# Patient Record
Sex: Female | Born: 1935 | Race: White | Hispanic: No | Smoking: Never smoker
Health system: Southern US, Community
[De-identification: ages and names within clinical notes are randomized; demographics above are authoritative.]

## PROBLEM LIST (undated history)

## (undated) DIAGNOSIS — Z8719 Personal history of other diseases of the digestive system: Secondary | ICD-10-CM

## (undated) DIAGNOSIS — I340 Nonrheumatic mitral (valve) insufficiency: Secondary | ICD-10-CM

## (undated) DIAGNOSIS — R221 Localized swelling, mass and lump, neck: Secondary | ICD-10-CM

## (undated) DIAGNOSIS — R32 Unspecified urinary incontinence: Secondary | ICD-10-CM

## (undated) DIAGNOSIS — Z950 Presence of cardiac pacemaker: Secondary | ICD-10-CM

## (undated) DIAGNOSIS — I739 Peripheral vascular disease, unspecified: Secondary | ICD-10-CM

## (undated) DIAGNOSIS — I495 Sick sinus syndrome: Secondary | ICD-10-CM

## (undated) DIAGNOSIS — M1990 Unspecified osteoarthritis, unspecified site: Secondary | ICD-10-CM

## (undated) DIAGNOSIS — R002 Palpitations: Secondary | ICD-10-CM

## (undated) DIAGNOSIS — I471 Supraventricular tachycardia, unspecified: Secondary | ICD-10-CM

## (undated) HISTORY — PX: INSERT / REPLACE / REMOVE PACEMAKER: SUR710

## (undated) HISTORY — DX: Localized swelling, mass and lump, neck: R22.1

## (undated) HISTORY — PX: CHOLECYSTECTOMY: SHX55

## (undated) HISTORY — PX: ABDOMINAL HYSTERECTOMY: SHX81

---

## 2013-01-06 DIAGNOSIS — R221 Localized swelling, mass and lump, neck: Secondary | ICD-10-CM

## 2013-01-06 HISTORY — DX: Localized swelling, mass and lump, neck: R22.1

## 2013-01-09 ENCOUNTER — Other Ambulatory Visit: Payer: Self-pay | Admitting: Vascular Surgery

## 2013-01-09 DIAGNOSIS — R220 Localized swelling, mass and lump, head: Secondary | ICD-10-CM

## 2013-01-25 ENCOUNTER — Encounter: Payer: Self-pay | Admitting: Vascular Surgery

## 2013-02-08 ENCOUNTER — Other Ambulatory Visit (HOSPITAL_COMMUNITY): Payer: Medicare Other

## 2013-02-08 ENCOUNTER — Encounter: Payer: Medicare Other | Admitting: Vascular Surgery

## 2013-09-21 ENCOUNTER — Other Ambulatory Visit: Payer: Self-pay | Admitting: Orthopedic Surgery

## 2013-10-18 ENCOUNTER — Inpatient Hospital Stay (HOSPITAL_COMMUNITY): Admission: RE | Admit: 2013-10-18 | Payer: Medicare Other | Source: Ambulatory Visit | Admitting: Orthopedic Surgery

## 2013-10-18 ENCOUNTER — Encounter (HOSPITAL_COMMUNITY): Admission: RE | Payer: Self-pay | Source: Ambulatory Visit

## 2013-10-18 SURGERY — ARTHROPLASTY, HIP, TOTAL, ANTERIOR APPROACH
Anesthesia: Choice | Site: Hip | Laterality: Right

## 2014-07-26 NOTE — H&P (Signed)
TOTAL HIP ADMISSION H&P  Patient is admitted for right total hip arthroplasty, anterior approach.  Subjective:  Chief Complaint:    Right hip primary OA / pain  HPI: [employer], 80 y.o. female, has a history of pain and functional disability in the right hip(s) due to arthritis and patient has failed non-surgical conservative treatments for greater than 12 weeks to include NSAID's and/or analgesics, corticosteriod injections, use of assistive devices and activity modification.  Onset of symptoms was gradual starting 2+ years ago with gradually worsening course since that time.The patient noted no past surgery on the right hip(s).  Patient currently rates pain in the right hip at 9 out of 10 with activity. Patient has night pain, worsening of pain with activity and weight bearing, trendelenberg gait, pain that interfers with activities of daily living and pain with passive range of motion. Patient has evidence of periarticular osteophytes and joint space narrowing by imaging studies. This condition presents safety issues increasing the risk of falls.  There is no current active infection.  Risks, benefits and expectations were discussed with the patient.  Risks including but not limited to the risk of anesthesia, blood clots, nerve damage, blood vessel damage, failure of the prosthesis, infection and up to and including death.  Patient understand the risks, benefits and expectations and wishes to proceed with surgery.   PCP: No primary care provider on file.  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  Past Medical History  Diagnosis Date  . Mass in neck Nov. 21, 2014    Right - enlarged and very tender    No past surgical history on file.   No  prescriptions prior to admission   Allergies  Allergen Reactions  . Penicillins     History  Substance Use Topics  . Smoking status: Not on file  . Smokeless tobacco: Not on file  . Alcohol Use: Not on file       Review of Systems  Constitutional: Negative.   HENT: Negative.   Eyes: Negative.   Respiratory: Negative.   Cardiovascular: Negative.   Gastrointestinal: Positive for constipation.  Genitourinary: Positive for urgency and frequency.  Musculoskeletal: Positive for joint pain.  Skin: Negative.   Neurological: Negative.   Endo/Heme/Allergies: Negative.   Psychiatric/Behavioral: Negative.     Objective:  Physical Exam  Constitutional: She is oriented to person, place, and time. She appears well-developed and well-nourished.  HENT:  Head: Normocephalic.  Eyes: Pupils are equal, round, and reactive to light.  Neck: Neck supple. No JVD present. No tracheal deviation present. No thyromegaly present.  Cardiovascular: Normal rate, regular rhythm, normal heart sounds and intact distal pulses.   Respiratory: Effort normal and breath sounds normal. No stridor. No respiratory distress. She has no wheezes.  GI: Soft. There is no tenderness. There is no guarding.  Musculoskeletal:       Right hip: She exhibits decreased range of motion, decreased strength, tenderness and bony tenderness. She exhibits no swelling, no deformity and no laceration.  Lymphadenopathy:    She has no cervical adenopathy.  Neurological: She is alert and  oriented to person, place, and time.  Skin: Skin is warm and dry.  Psychiatric: She has a normal mood and affect.      Imaging Review Plain radiographs demonstrate severe degenerative joint disease of the right hip(s). The bone quality appears to be good for age and reported activity level.  Assessment/Plan:  End stage arthritis, right hip(s)  The patient history, physical examination, clinical judgement of the provider and imaging studies  are consistent with end stage degenerative joint disease of the right hip(s) and total hip arthroplasty is deemed medically necessary. The treatment options including medical management, injection therapy, arthroscopy and arthroplasty were discussed at length. The risks and benefits of total hip arthroplasty were presented and reviewed. The risks due to aseptic loosening, infection, stiffness, dislocation/subluxation,  thromboembolic complications and other imponderables were discussed.  The patient acknowledged the explanation, agreed to proceed with the plan and consent was signed. Patient is being admitted for inpatient treatment for surgery, pain control, PT, OT, prophylactic antibiotics, VTE prophylaxis, progressive ambulation and ADL's and discharge planning.The patient is planning to be discharged home with home health services.     Anastasio Auerbach    PA-C  07/26/2014, 1:42 PM

## 2014-08-07 ENCOUNTER — Encounter (HOSPITAL_COMMUNITY): Payer: Self-pay

## 2014-08-07 NOTE — Patient Instructions (Signed)
[address] [place]  08/07/2014   Your procedure is scheduled on:   6-28 -2016 Tuesday  Enter through [facility]  Entrance and follow signs to [facility]. Arrive at      0955  AM ..  (Limit 1 person with you).  Call this number if you have problems the morning of surgery: [phone]  Or Presurgical Testing [phone].              For Living Will and/or Health Care Power Attorney Forms: please provide copy for your medical record,may bring AM of surgery(Forms should be already notarized              -we do not provide this service).(Yes/ No information preferred today).             For Cpap use: Bring mask and tubing only.   Do not eat food/ or drink: After Midnight.      Take these medicines the morning of surgery with A SIP OF WATER:  Metoprolol. Omeprazole. Loratadine-if need.   Do not wear jewelry, make-up or nail polish.  Do not wear deodorant, lotions, powders, or perfumes.   Do not shave legs and under arms- 48 hours(2 days) prior to first CHG shower.(Shaving face and neck okay.)  Do not bring valuables to the hospital.(Hospital is not responsible for lost valuables).  Contacts, dentures or removable bridgework, body piercing, hair pins may not be worn into surgery.  Leave suitcase in the car. After surgery it may be brought to your room.  For patients admitted to the hospital, checkout time is 11:00 AM the day of discharge.(Restricted visitors-Any Persons displaying flu-like symptoms or illness).    Patients discharged the day of surgery will not be allowed to drive home. Must have responsible person with you x 24 hours once discharged.  Name and phone number of your driver:      Please read over the following fact sheets that you were given:  CHG(Chlorhexidine Gluconate 4% Surgical Soap) use, MRSA Information, Blood Transfusion fact sheet, Incentive Spirometry Instruction.  Remember : Type/Screen "Blue armbands" - may not be removed once applied(would result in  being retested AM of surgery, if removed).         [facility] - Preparing for Surgery Before surgery, you can play an important role.  Because skin is not sterile, your skin needs to be as free of germs as possible.  You can reduce the number of germs on your skin by washing with CHG (chlorahexidine gluconate) soap before surgery.  CHG is an antiseptic cleaner which kills germs and bonds with the skin to continue killing germs even after washing. Please DO NOT use if you have an allergy to CHG or antibacterial soaps.  If your skin becomes reddened/irritated stop using the CHG and inform your nurse when you arrive at Short Stay. Do not shave (including legs and underarms) for at least 48 hours prior to the first CHG shower.  You may shave your face/neck. Please follow these instructions carefully:  1.  Shower with CHG Soap the night before surgery and the  morning of Surgery.  2.  If you choose to wash your hair, wash your hair first as usual with your  normal  shampoo.  3.  After you shampoo, rinse your hair and body thoroughly to remove the  shampoo.  4.  Use CHG as you would any other liquid soap.  You can apply chg directly  to the skin and wash                       Gently with a scrungie or clean washcloth.  5.  Apply the CHG Soap to your body ONLY FROM THE NECK DOWN.   Do not use on face/ open                           Wound or open sores. Avoid contact with eyes, ears mouth and genitals (private parts).                       Wash face,  Genitals (private parts) with your normal soap.             6.  Wash thoroughly, paying special attention to the area where your surgery  will be performed.  7.  Thoroughly rinse your body with warm water from the neck down.  8.  DO NOT shower/wash with your normal soap after using and rinsing off  the CHG Soap.                9.  Pat yourself dry with a clean towel.            10.  Wear clean pajamas.            11.  Place clean  sheets on your bed the night of your first shower and do not  sleep with pets. Day of Surgery : Do not apply any lotions/deodorants the morning of surgery.  Please wear clean clothes to the hospital/surgery center.  FAILURE TO FOLLOW THESE INSTRUCTIONS MAY RESULT IN THE CANCELLATION OF YOUR SURGERY PATIENT SIGNATURE_________________________________  NURSE SIGNATURE__________________________________  ________________________________________________________________________   Dawn Henderson  An incentive spirometer is a tool that can help keep your lungs clear and active. This tool measures how well you are filling your lungs with each breath. Taking long deep breaths may help reverse or decrease the chance of developing breathing (pulmonary) problems (especially infection) following:  A long period of time when you are unable to move or be active. BEFORE THE PROCEDURE   If the spirometer includes an indicator to show your best effort, your nurse or respiratory therapist will set it to a desired goal.  If possible, sit up straight or lean slightly forward. Try not to slouch.  Hold the incentive spirometer in an upright position. INSTRUCTIONS FOR USE   Sit on the edge of your bed if possible, or sit up as far as you can in bed or on a chair.  Hold the incentive spirometer in an upright position.  Breathe out normally.  Place the mouthpiece in your mouth and seal your lips tightly around it.  Breathe in slowly and as deeply as possible, raising the piston or the ball toward the top of the column.  Hold your breath for 3-5 seconds or for as long as possible. Allow the piston or ball to fall to the bottom of the column.  Remove the mouthpiece from your mouth and breathe out normally.  Rest for a few seconds and repeat Steps 1 through 7 at least 10 times every 1-2 hours when you are awake. Take your time and take a few normal breaths between deep breaths.  The spirometer may  include an indicator to  show your best effort. Use the indicator as a goal to work toward during each repetition.  After each set of 10 deep breaths, practice coughing to be sure your lungs are clear. If you have an incision (the cut made at the time of surgery), support your incision when coughing by placing a pillow or rolled up towels firmly against it. Once you are able to get out of bed, walk around indoors and cough well. You may stop using the incentive spirometer when instructed by your caregiver.  RISKS AND COMPLICATIONS  Take your time so you do not get dizzy or light-headed.  If you are in pain, you may need to take or ask for pain medication before doing incentive spirometry. It is harder to take a deep breath if you are having pain. AFTER USE  Rest and breathe slowly and easily.  It can be helpful to keep track of a log of your progress. Your caregiver can provide you with a simple table to help with this. If you are using the spirometer at home, follow these instructions: SEEK MEDICAL CARE IF:   You are having difficultly using the spirometer.  You have trouble using the spirometer as often as instructed.  Your pain medication is not giving enough relief while using the spirometer.  You develop fever of 100.5 F (38.1 C) or higher. SEEK IMMEDIATE MEDICAL CARE IF:   You cough up bloody sputum that had not been present before.  You develop fever of 102 F (38.9 C) or greater.  You develop worsening pain at or near the incision site. MAKE SURE YOU:   Understand these instructions.  Will watch your condition.  Will get help right away if you are not doing well or get worse. Document Released: 06/15/2006 Document Revised: 04/27/2011 Document Reviewed: 08/16/2006 [employer] Patient Information 2014 [employer], [place].   ________________________________________________________________________  WHAT IS A BLOOD TRANSFUSION? Blood Transfusion Information  A transfusion  is the replacement of blood or some of its parts. Blood is made up of multiple cells which provide different functions.  Red blood cells carry oxygen and are used for blood loss replacement.  White blood cells fight against infection.  Platelets control bleeding.  Plasma helps clot blood.  Other blood products are available for specialized needs, such as hemophilia or other clotting disorders. BEFORE THE TRANSFUSION  Who gives blood for transfusions?   Healthy volunteers who are fully evaluated to make sure their blood is safe. This is blood bank blood. Transfusion therapy is the safest it has ever been in the practice of medicine. Before blood is taken from a donor, a complete history is taken to make sure that person has no history of diseases nor engages in risky social behavior (examples are intravenous drug use or sexual activity with multiple partners). The donor's travel history is screened to minimize risk of transmitting infections, such as malaria. The donated blood is tested for signs of infectious diseases, such as HIV and hepatitis. The blood is then tested to be sure it is compatible with you in order to minimize the chance of a transfusion reaction. If you or a relative donates blood, this is often done in anticipation of surgery and is not appropriate for emergency situations. It takes many days to process the donated blood. RISKS AND COMPLICATIONS Although transfusion therapy is very safe and saves many lives, the main dangers of transfusion include:   Getting an infectious disease.  Developing a transfusion reaction. This is an allergic reaction to  something in the blood you were given. Every precaution is taken to prevent this. The decision to have a blood transfusion has been considered carefully by your caregiver before blood is given. Blood is not given unless the benefits outweigh the risks. AFTER THE TRANSFUSION  Right after receiving a blood transfusion, you will  usually feel much better and more energetic. This is especially true if your red blood cells have gotten low (anemic). The transfusion raises the level of the red blood cells which carry oxygen, and this usually causes an energy increase.  The nurse administering the transfusion will monitor you carefully for complications. HOME CARE INSTRUCTIONS  No special instructions are needed after a transfusion. You may find your energy is better. Speak with your caregiver about any limitations on activity for underlying diseases you may have. SEEK MEDICAL CARE IF:   Your condition is not improving after your transfusion.  You develop redness or irritation at the intravenous (IV) site. SEEK IMMEDIATE MEDICAL CARE IF:  Any of the following symptoms occur over the next 12 hours:  Shaking chills.  You have a temperature by mouth above 102 F (38.9 C), not controlled by medicine.  Chest, back, or muscle pain.  People around you feel you are not acting correctly or are confused.  Shortness of breath or difficulty breathing.  Dizziness and fainting.  You get a rash or develop hives.  You have a decrease in urine output.  Your urine turns a dark color or changes to pink, red, or brown. Any of the following symptoms occur over the next 10 days:  You have a temperature by mouth above 102 F (38.9 C), not controlled by medicine.  Shortness of breath.  Weakness after normal activity.  The white part of the eye turns yellow (jaundice).  You have a decrease in the amount of urine or are urinating less often.  Your urine turns a dark color or changes to pink, red, or brown. Document Released: 01/31/2000 Document Revised: 04/27/2011 Document Reviewed: 09/19/2007 [facility] Patient Information 2014 [place], [place].  _______________________________________________________________________

## 2014-08-07 NOTE — Progress Notes (Signed)
08-07-14 1410 Would you like a urinalysis for this pt for PAT visit, not entered with order sets.

## 2014-08-08 ENCOUNTER — Inpatient Hospital Stay (HOSPITAL_COMMUNITY)
Admission: RE | Admit: 2014-08-08 | Discharge: 2014-08-08 | Disposition: A | Discharge status: Home / Self Care | Payer: Medicare Other | Source: Ambulatory Visit

## 2014-08-08 HISTORY — DX: Presence of cardiac pacemaker: Z95.0

## 2014-08-08 NOTE — Patient Instructions (Addendum)
Dawn Henderson  08/08/2014   Your procedure is scheduled on:      08/14/2014    Report to [facility] Main  Entrance take [facility]  elevators to 3rd floor to  Short Stay Center at    1000 AM.  Call this number if you have problems the morning of surgery [phone]   Remember: ONLY 1 PERSON MAY GO WITH YOU TO SHORT STAY TO GET  READY MORNING OF YOUR SURGERY.  Do not eat food or drink liquids :After Midnight.     Take these medicines the morning of surgery with A SIP OF WATER:    Hydrocodone if needed, Claritin if needed, Metoprolol ( Lopressor), Prilosec                                You may not have any metal on your body including hair pins and              piercings  Do not wear jewelry, make-up, lotions, powders or perfumes, deodorant             Do not wear nail polish.  Do not shave  48 hours prior to surgery.                Do not bring valuables to the hospital. [facility] IS NOT             RESPONSIBLE   FOR VALUABLES.  Contacts, dentures or bridgework may not be worn into surgery.  Leave suitcase in the car. After surgery it may be brought to your room.        Special Instructions: coughing and deep breathing exercises, leg exercises               Please read over the following fact sheets you were given: _____________________________________________________________________             [facility] - Preparing for Surgery Before surgery, you can play an important role.  Because skin is not sterile, your skin needs to be as free of germs as possible.  You can reduce the number of germs on your skin by washing with CHG (chlorahexidine gluconate) soap before surgery.  CHG is an antiseptic cleaner which kills germs and bonds with the skin to continue killing germs even after washing. Please DO NOT use if you have an allergy to CHG or antibacterial soaps.  If your skin becomes reddened/irritated stop using the CHG and inform your nurse when you arrive at  Short Stay. Do not shave (including legs and underarms) for at least 48 hours prior to the first CHG shower.  You may shave your face/neck. Please follow these instructions carefully:  1.  Shower with CHG Soap the night before surgery and the  morning of Surgery.  2.  If you choose to wash your hair, wash your hair first as usual with your  normal  shampoo.  3.  After you shampoo, rinse your hair and body thoroughly to remove the  shampoo.                           4.  Use CHG as you would any other liquid soap.  You can apply chg directly  to the skin and wash  Gently with a scrungie or clean washcloth.  5.  Apply the CHG Soap to your body ONLY FROM THE NECK DOWN.   Do not use on face/ open                           Wound or open sores. Avoid contact with eyes, ears mouth and genitals (private parts).                       Wash face,  Genitals (private parts) with your normal soap.             6.  Wash thoroughly, paying special attention to the area where your surgery  will be performed.  7.  Thoroughly rinse your body with warm water from the neck down.  8.  DO NOT shower/wash with your normal soap after using and rinsing off  the CHG Soap.                9.  Pat yourself dry with a clean towel.            10.  Wear clean pajamas.            11.  Place clean sheets on your bed the night of your first shower and do not  sleep with pets. Day of Surgery : Do not apply any lotions/deodorants the morning of surgery.  Please wear clean clothes to the hospital/surgery center.  FAILURE TO FOLLOW THESE INSTRUCTIONS MAY RESULT IN THE CANCELLATION OF YOUR SURGERY PATIENT SIGNATURE_________________________________  NURSE SIGNATURE__________________________________  ________________________________________________________________________  WHAT IS A BLOOD TRANSFUSION? Blood Transfusion Information  A transfusion is the replacement of blood or some of its parts. Blood is made up  of multiple cells which provide different functions.  Red blood cells carry oxygen and are used for blood loss replacement.  White blood cells fight against infection.  Platelets control bleeding.  Plasma helps clot blood.  Other blood products are available for specialized needs, such as hemophilia or other clotting disorders. BEFORE THE TRANSFUSION  Who gives blood for transfusions?   Healthy volunteers who are fully evaluated to make sure their blood is safe. This is blood bank blood. Transfusion therapy is the safest it has ever been in the practice of medicine. Before blood is taken from a donor, a complete history is taken to make sure that person has no history of diseases nor engages in risky social behavior (examples are intravenous drug use or sexual activity with multiple partners). The donor's travel history is screened to minimize risk of transmitting infections, such as malaria. The donated blood is tested for signs of infectious diseases, such as HIV and hepatitis. The blood is then tested to be sure it is compatible with you in order to minimize the chance of a transfusion reaction. If you or a relative donates blood, this is often done in anticipation of surgery and is not appropriate for emergency situations. It takes many days to process the donated blood. RISKS AND COMPLICATIONS Although transfusion therapy is very safe and saves many lives, the main dangers of transfusion include:  1. Getting an infectious disease. 2. Developing a transfusion reaction. This is an allergic reaction to something in the blood you were given. Every precaution is taken to prevent this. The decision to have a blood transfusion has been considered carefully by your caregiver before blood is given. Blood is not given unless the benefits outweigh  the risks. AFTER THE TRANSFUSION  Right after receiving a blood transfusion, you will usually feel much better and more energetic. This is especially true  if your red blood cells have gotten low (anemic). The transfusion raises the level of the red blood cells which carry oxygen, and this usually causes an energy increase.  The nurse administering the transfusion will monitor you carefully for complications. HOME CARE INSTRUCTIONS  No special instructions are needed after a transfusion. You may find your energy is better. Speak with your caregiver about any limitations on activity for underlying diseases you may have. SEEK MEDICAL CARE IF:   Your condition is not improving after your transfusion.  You develop redness or irritation at the intravenous (IV) site. SEEK IMMEDIATE MEDICAL CARE IF:  Any of the following symptoms occur over the next 12 hours:  Shaking chills.  You have a temperature by mouth above 102 F (38.9 C), not controlled by medicine.  Chest, back, or muscle pain.  People around you feel you are not acting correctly or are confused.  Shortness of breath or difficulty breathing.  Dizziness and fainting.  You get a rash or develop hives.  You have a decrease in urine output.  Your urine turns a dark color or changes to pink, red, or brown. Any of the following symptoms occur over the next 10 days:  You have a temperature by mouth above 102 F (38.9 C), not controlled by medicine.  Shortness of breath.  Weakness after normal activity.  The white part of the eye turns yellow (jaundice).  You have a decrease in the amount of urine or are urinating less often.  Your urine turns a dark color or changes to pink, red, or brown. Document Released: 01/31/2000 Document Revised: 04/27/2011 Document Reviewed: 09/19/2007 ExitCare Patient Information 2014 [place].  _______________________________________________________________________  Incentive Spirometer  An incentive spirometer is a tool that can help keep your lungs clear and active. This tool measures how well you are filling your lungs with each  breath. Taking long deep breaths may help reverse or decrease the chance of developing breathing (pulmonary) problems (especially infection) following:  A long period of time when you are unable to move or be active. BEFORE THE PROCEDURE   If the spirometer includes an indicator to show your best effort, your nurse or respiratory therapist will set it to a desired goal.  If possible, sit up straight or lean slightly forward. Try not to slouch.  Hold the incentive spirometer in an upright position. INSTRUCTIONS FOR USE  3. Sit on the edge of your bed if possible, or sit up as far as you can in bed or on a chair. 4. Hold the incentive spirometer in an upright position. 5. Breathe out normally. 6. Place the mouthpiece in your mouth and seal your lips tightly around it. 7. Breathe in slowly and as deeply as possible, raising the piston or the ball toward the top of the column. 8. Hold your breath for 3-5 seconds or for as long as possible. Allow the piston or ball to fall to the bottom of the column. 9. Remove the mouthpiece from your mouth and breathe out normally. 10. Rest for a few seconds and repeat Steps 1 through 7 at least 10 times every 1-2 hours when you are awake. Take your time and take a few normal breaths between deep breaths. 11. The spirometer may include an indicator to show your best effort. Use the indicator as a goal to work  toward during each repetition. 12. After each set of 10 deep breaths, practice coughing to be sure your lungs are clear. If you have an incision (the cut made at the time of surgery), support your incision when coughing by placing a pillow or rolled up towels firmly against it. Once you are able to get out of bed, walk around indoors and cough well. You may stop using the incentive spirometer when instructed by your caregiver.  RISKS AND COMPLICATIONS  Take your time so you do not get dizzy or light-headed.  If you are in pain, you may need to take or ask  for pain medication before doing incentive spirometry. It is harder to take a deep breath if you are having pain. AFTER USE  Rest and breathe slowly and easily.  It can be helpful to keep track of a log of your progress. Your caregiver can provide you with a simple table to help with this. If you are using the spirometer at home, follow these instructions: [place] IF:   You are having difficultly using the spirometer.  You have trouble using the spirometer as often as instructed.  Your pain medication is not giving enough relief while using the spirometer.  You develop fever of 100.5 F (38.1 C) or higher. SEEK IMMEDIATE MEDICAL CARE IF:   You cough up bloody sputum that had not been present before.  You develop fever of 102 F (38.9 C) or greater.  You develop worsening pain at or near the incision site. MAKE SURE YOU:   Understand these instructions.  Will watch your condition.  Will get help right away if you are not doing well or get worse. Document Released: 06/15/2006 Document Revised: 04/27/2011 Document Reviewed: 08/16/2006 [facility] Patient Information 2014 [place], [place].   ________________________________________________________________________

## 2014-08-10 ENCOUNTER — Encounter (HOSPITAL_COMMUNITY): Payer: Self-pay

## 2014-08-10 ENCOUNTER — Encounter (HOSPITAL_COMMUNITY)
Admission: RE | Admit: 2014-08-10 | Discharge: 2014-08-10 | Disposition: A | Discharge status: Home / Self Care | Payer: Medicare Other | Source: Ambulatory Visit | Attending: Orthopedic Surgery | Admitting: Orthopedic Surgery

## 2014-08-10 DIAGNOSIS — Z01818 Encounter for other preprocedural examination: Secondary | ICD-10-CM | POA: Diagnosis not present

## 2014-08-10 DIAGNOSIS — M1611 Unilateral primary osteoarthritis, right hip: Secondary | ICD-10-CM | POA: Insufficient documentation

## 2014-08-10 HISTORY — DX: Personal history of other diseases of the digestive system: Z87.19

## 2014-08-10 HISTORY — DX: Palpitations: R00.2

## 2014-08-10 HISTORY — DX: Supraventricular tachycardia, unspecified: I47.10

## 2014-08-10 HISTORY — DX: Nonrheumatic mitral (valve) insufficiency: I34.0

## 2014-08-10 HISTORY — DX: Peripheral vascular disease, unspecified: I73.9

## 2014-08-10 HISTORY — DX: Sick sinus syndrome: I49.5

## 2014-08-10 HISTORY — DX: Unspecified urinary incontinence: R32

## 2014-08-10 HISTORY — DX: Unspecified osteoarthritis, unspecified site: M19.90

## 2014-08-10 HISTORY — DX: Supraventricular tachycardia: I47.1

## 2014-08-10 LAB — CBC
HCT: 41.8 % (ref 36.0–46.0)
Hemoglobin: 14.3 g/dL (ref 12.0–15.0)
MCH: 33.5 pg (ref 26.0–34.0)
MCHC: 34.2 g/dL (ref 30.0–36.0)
MCV: 97.9 fL (ref 78.0–100.0)
PLATELETS: 124 10*3/uL — AB (ref 150–400)
RBC: 4.27 MIL/uL (ref 3.87–5.11)
RDW: 12.3 % (ref 11.5–15.5)
WBC: 4.2 10*3/uL (ref 4.0–10.5)

## 2014-08-10 LAB — BASIC METABOLIC PANEL
Anion gap: 8 (ref 5–15)
BUN: 22 mg/dL — AB (ref 6–20)
CHLORIDE: 101 mmol/L (ref 101–111)
CO2: 31 mmol/L (ref 22–32)
CREATININE: 0.86 mg/dL (ref 0.44–1.00)
Calcium: 10 mg/dL (ref 8.9–10.3)
GFR calc Af Amer: >60 mL/min (ref >60)
GFR calc non Af Amer: >60 mL/min (ref >60)
Glucose, Bld: 99 mg/dL (ref 65–99)
Potassium: 5.1 mmol/L (ref 3.5–5.1)
Sodium: 140 mmol/L (ref 135–145)

## 2014-08-10 LAB — SURGICAL PCR SCREEN
MRSA, PCR: NEGATIVE
STAPHYLOCOCCUS AUREUS: NEGATIVE

## 2014-08-10 LAB — PROTIME-INR
INR: 1 (ref 0.00–1.49)
Prothrombin Time: 13.4 seconds (ref 11.6–15.2)

## 2014-08-10 LAB — URINALYSIS, ROUTINE W REFLEX MICROSCOPIC
Bilirubin Urine: NEGATIVE
GLUCOSE, UA: NEGATIVE mg/dL
Hgb urine dipstick: NEGATIVE
Ketones, ur: NEGATIVE mg/dL
LEUKOCYTES UA: NEGATIVE
Nitrite: NEGATIVE
PH: 7.5 (ref 5.0–8.0)
Protein, ur: NEGATIVE mg/dL
SPECIFIC GRAVITY, URINE: 1.022 (ref 1.005–1.030)
Urobilinogen, UA: 0.2 mg/dL (ref 0.0–1.0)

## 2014-08-10 LAB — APTT: APTT: 35 s (ref 24–37)

## 2014-08-10 LAB — ABO/RH: ABO/RH(D): A POS

## 2014-08-10 NOTE — Progress Notes (Signed)
CBC and BMP results done 08/10/2014 faxed via EPIc to Dr Charlann Boxer.

## 2014-08-10 NOTE — Progress Notes (Addendum)
ECHO- 07/24/2014 on chart  Stress 2012 on chart  08/06/14 Clearance- Dr Lorelei Pont on chart  Last Device check- 06/01/14 on chart and 12/01/2013 on chart   Dr Georganna Skeans- LOV 06/11/2014 on chart  EKG- 06/11/2014 on chart

## 2014-08-14 ENCOUNTER — Inpatient Hospital Stay (HOSPITAL_COMMUNITY): Payer: Medicare Other | Admitting: Registered Nurse

## 2014-08-14 ENCOUNTER — Encounter (HOSPITAL_COMMUNITY)
Admission: RE | Disposition: A | Discharge status: Home Health | Payer: Self-pay | Source: Ambulatory Visit | Attending: Orthopedic Surgery

## 2014-08-14 ENCOUNTER — Encounter (HOSPITAL_COMMUNITY): Payer: Self-pay | Admitting: *Deleted

## 2014-08-14 ENCOUNTER — Inpatient Hospital Stay (HOSPITAL_COMMUNITY): Payer: Medicare Other

## 2014-08-14 ENCOUNTER — Inpatient Hospital Stay (HOSPITAL_COMMUNITY)
Admission: RE | Admit: 2014-08-14 | Discharge: 2014-08-16 | DRG: 470 | Disposition: A | Discharge status: Home Health | Payer: Medicare Other | Source: Ambulatory Visit | Attending: Orthopedic Surgery | Admitting: Orthopedic Surgery

## 2014-08-14 DIAGNOSIS — Z6827 Body mass index (BMI) 27.0-27.9, adult: Secondary | ICD-10-CM

## 2014-08-14 DIAGNOSIS — Z96649 Presence of unspecified artificial hip joint: Secondary | ICD-10-CM

## 2014-08-14 DIAGNOSIS — M25551 Pain in right hip: Secondary | ICD-10-CM | POA: Diagnosis present

## 2014-08-14 DIAGNOSIS — Z950 Presence of cardiac pacemaker: Secondary | ICD-10-CM | POA: Diagnosis not present

## 2014-08-14 DIAGNOSIS — E663 Overweight: Secondary | ICD-10-CM | POA: Diagnosis present

## 2014-08-14 DIAGNOSIS — M1611 Unilateral primary osteoarthritis, right hip: Principal | ICD-10-CM | POA: Diagnosis present

## 2014-08-14 DIAGNOSIS — Z01812 Encounter for preprocedural laboratory examination: Secondary | ICD-10-CM

## 2014-08-14 HISTORY — PX: TOTAL HIP ARTHROPLASTY: SHX124

## 2014-08-14 LAB — TYPE AND SCREEN
ABO/RH(D): A POS
Antibody Screen: NEGATIVE

## 2014-08-14 SURGERY — ARTHROPLASTY, HIP, TOTAL, ANTERIOR APPROACH
Anesthesia: Spinal | Site: Hip | Laterality: Right

## 2014-08-14 MED ORDER — METOCLOPRAMIDE HCL 10 MG PO TABS: 5.0000 mg | ORAL_TABLET | Freq: Three times a day (TID) | ORAL | Status: DC | PRN

## 2014-08-14 MED ORDER — LACTATED RINGERS IV SOLN: INTRAVENOUS | Status: DC

## 2014-08-14 MED ORDER — FERROUS SULFATE 325 (65 FE) MG PO TABS
325.0000 mg | ORAL_TABLET | Freq: Three times a day (TID) | ORAL | Status: DC
  Administered 2014-08-14 – 2014-08-15 (×3): 325 mg via ORAL
  Filled 2014-08-14 (×8): qty 1

## 2014-08-14 MED ORDER — PHENYLEPHRINE HCL 10 MG/ML IJ SOLN
INTRAMUSCULAR | Status: DC | PRN
  Administered 2014-08-14 (×6): 40 ug via INTRAVENOUS

## 2014-08-14 MED ORDER — PROPOFOL 10 MG/ML IV BOLUS
INTRAVENOUS | Status: AC
  Filled 2014-08-14: qty 20

## 2014-08-14 MED ORDER — HYDROCODONE-ACETAMINOPHEN 7.5-325 MG PO TABS
1.0000 | ORAL_TABLET | ORAL | Status: DC
  Administered 2014-08-14 – 2014-08-15 (×2): 1 via ORAL
  Administered 2014-08-15: 2 via ORAL
  Administered 2014-08-15: 1 via ORAL
  Administered 2014-08-15 (×3): 2 via ORAL
  Administered 2014-08-16 (×4): 1 via ORAL
  Filled 2014-08-14 (×4): qty 1
  Filled 2014-08-14: qty 2
  Filled 2014-08-14: qty 1
  Filled 2014-08-14 (×2): qty 2
  Filled 2014-08-14: qty 1
  Filled 2014-08-14 (×2): qty 2

## 2014-08-14 MED ORDER — MIDAZOLAM HCL 2 MG/2ML IJ SOLN
INTRAMUSCULAR | Status: AC
  Filled 2014-08-14: qty 2

## 2014-08-14 MED ORDER — NON FORMULARY: 20.0000 mg | Freq: Every day | Status: DC

## 2014-08-14 MED ORDER — HYDROMORPHONE HCL 1 MG/ML IJ SOLN
0.5000 mg | INTRAMUSCULAR | Status: DC | PRN
  Administered 2014-08-14 (×2): 1 mg via INTRAVENOUS
  Administered 2014-08-14: 0.5 mg via INTRAVENOUS
  Administered 2014-08-14 – 2014-08-15 (×3): 1 mg via INTRAVENOUS
  Filled 2014-08-14 (×7): qty 1

## 2014-08-14 MED ORDER — CEFAZOLIN SODIUM 1-5 GM-% IV SOLN
INTRAVENOUS | Status: DC | PRN
  Administered 2014-08-14: 2 g via INTRAVENOUS

## 2014-08-14 MED ORDER — PHENYLEPHRINE 40 MCG/ML (10ML) SYRINGE FOR IV PUSH (FOR BLOOD PRESSURE SUPPORT)
PREFILLED_SYRINGE | INTRAVENOUS | Status: AC
  Filled 2014-08-14: qty 10

## 2014-08-14 MED ORDER — POLYETHYLENE GLYCOL 3350 17 G PO PACK
17.0000 g | PACK | Freq: Two times a day (BID) | ORAL | Status: DC
  Administered 2014-08-14 – 2014-08-16 (×4): 17 g via ORAL

## 2014-08-14 MED ORDER — FENTANYL CITRATE (PF) 250 MCG/5ML IJ SOLN
INTRAMUSCULAR | Status: AC
  Filled 2014-08-14: qty 5

## 2014-08-14 MED ORDER — DEXAMETHASONE SODIUM PHOSPHATE 10 MG/ML IJ SOLN
10.0000 mg | Freq: Once | INTRAMUSCULAR | Status: AC
  Administered 2014-08-15: 10 mg via INTRAVENOUS
  Filled 2014-08-14: qty 1

## 2014-08-14 MED ORDER — SODIUM CHLORIDE 0.9 % IV SOLN
100.0000 mL/h | INTRAVENOUS | Status: DC
  Administered 2014-08-14 – 2014-08-15 (×2): 100 mL/h via INTRAVENOUS
  Filled 2014-08-14 (×12): qty 1000

## 2014-08-14 MED ORDER — HYDROMORPHONE HCL 1 MG/ML IJ SOLN: 0.2500 mg | INTRAMUSCULAR | Status: DC | PRN

## 2014-08-14 MED ORDER — SODIUM CHLORIDE 0.9 % IR SOLN
Status: DC | PRN
  Administered 2014-08-14: 1000 mL

## 2014-08-14 MED ORDER — PROPOFOL INFUSION 10 MG/ML OPTIME
INTRAVENOUS | Status: DC | PRN
  Administered 2014-08-14: 75 ug/kg/min via INTRAVENOUS

## 2014-08-14 MED ORDER — MAGNESIUM CITRATE PO SOLN: 1.0000 | Freq: Once | ORAL | Status: AC | PRN

## 2014-08-14 MED ORDER — LACTATED RINGERS IV SOLN
INTRAVENOUS | Status: DC | PRN
  Administered 2014-08-14 (×2): via INTRAVENOUS

## 2014-08-14 MED ORDER — PHENOL 1.4 % MT LIQD: 1.0000 | OROMUCOSAL | Status: DC | PRN

## 2014-08-14 MED ORDER — MENTHOL 3 MG MT LOZG: 1.0000 | LOZENGE | OROMUCOSAL | Status: DC | PRN

## 2014-08-14 MED ORDER — LIDOCAINE HCL (CARDIAC) 20 MG/ML IV SOLN
INTRAVENOUS | Status: AC
  Filled 2014-08-14: qty 10

## 2014-08-14 MED ORDER — METHOCARBAMOL 500 MG PO TABS
500.0000 mg | ORAL_TABLET | Freq: Four times a day (QID) | ORAL | Status: DC | PRN
  Administered 2014-08-14 – 2014-08-16 (×5): 500 mg via ORAL
  Filled 2014-08-14 (×5): qty 1

## 2014-08-14 MED ORDER — MEPERIDINE HCL 50 MG/ML IJ SOLN
INTRAMUSCULAR | Status: AC
  Filled 2014-08-14: qty 1

## 2014-08-14 MED ORDER — OMEPRAZOLE 20 MG PO CPDR
20.0000 mg | DELAYED_RELEASE_CAPSULE | Freq: Every day | ORAL | Status: DC
  Administered 2014-08-15 – 2014-08-16 (×2): 20 mg via ORAL
  Filled 2014-08-14 (×3): qty 1

## 2014-08-14 MED ORDER — CELECOXIB 200 MG PO CAPS
200.0000 mg | ORAL_CAPSULE | Freq: Two times a day (BID) | ORAL | Status: DC
  Administered 2014-08-15 – 2014-08-16 (×3): 200 mg via ORAL
  Filled 2014-08-14 (×8): qty 1

## 2014-08-14 MED ORDER — MIDAZOLAM HCL 5 MG/5ML IJ SOLN
INTRAMUSCULAR | Status: DC | PRN
  Administered 2014-08-14: 1 mg via INTRAVENOUS

## 2014-08-14 MED ORDER — METOPROLOL TARTRATE 50 MG PO TABS
50.0000 mg | ORAL_TABLET | Freq: Every day | ORAL | Status: DC
  Administered 2014-08-15 – 2014-08-16 (×2): 50 mg via ORAL
  Filled 2014-08-14 (×2): qty 1

## 2014-08-14 MED ORDER — DEXAMETHASONE SODIUM PHOSPHATE 10 MG/ML IJ SOLN
INTRAMUSCULAR | Status: AC
  Filled 2014-08-14: qty 1

## 2014-08-14 MED ORDER — FENTANYL CITRATE (PF) 100 MCG/2ML IJ SOLN
INTRAMUSCULAR | Status: DC | PRN
Start: 2014-08-14 — End: 2014-08-14
  Administered 2014-08-14: 50 ug via INTRAVENOUS
  Administered 2014-08-14: 25 ug via INTRAVENOUS

## 2014-08-14 MED ORDER — MEPERIDINE HCL 50 MG/ML IJ SOLN
6.2500 mg | INTRAMUSCULAR | Status: DC | PRN
  Administered 2014-08-14 (×2): 12.5 mg via INTRAVENOUS

## 2014-08-14 MED ORDER — ONDANSETRON HCL 4 MG/2ML IJ SOLN
INTRAMUSCULAR | Status: AC
  Filled 2014-08-14: qty 2

## 2014-08-14 MED ORDER — LORATADINE 10 MG PO TABS
10.0000 mg | ORAL_TABLET | Freq: Every day | ORAL | Status: DC | PRN
  Filled 2014-08-14: qty 1

## 2014-08-14 MED ORDER — METHOCARBAMOL 1000 MG/10ML IJ SOLN
500.0000 mg | Freq: Four times a day (QID) | INTRAMUSCULAR | Status: DC | PRN
  Administered 2014-08-14: 500 mg via INTRAVENOUS
  Filled 2014-08-14 (×2): qty 5

## 2014-08-14 MED ORDER — ONDANSETRON HCL 4 MG/2ML IJ SOLN
INTRAMUSCULAR | Status: DC | PRN
  Administered 2014-08-14: 4 mg via INTRAVENOUS

## 2014-08-14 MED ORDER — DEXAMETHASONE SODIUM PHOSPHATE 10 MG/ML IJ SOLN
10.0000 mg | Freq: Once | INTRAMUSCULAR | Status: AC
  Administered 2014-08-14: 10 mg via INTRAVENOUS

## 2014-08-14 MED ORDER — METOCLOPRAMIDE HCL 5 MG/ML IJ SOLN: 5.0000 mg | Freq: Three times a day (TID) | INTRAMUSCULAR | Status: DC | PRN

## 2014-08-14 MED ORDER — ONDANSETRON HCL 4 MG/2ML IJ SOLN: 4.0000 mg | Freq: Four times a day (QID) | INTRAMUSCULAR | Status: DC | PRN

## 2014-08-14 MED ORDER — ALUM & MAG HYDROXIDE-SIMETH 200-200-20 MG/5ML PO SUSP: 30.0000 mL | ORAL | Status: DC | PRN

## 2014-08-14 MED ORDER — CEFAZOLIN SODIUM-DEXTROSE 2-3 GM-% IV SOLR: 2.0000 g | INTRAVENOUS | Status: DC

## 2014-08-14 MED ORDER — CEFAZOLIN SODIUM-DEXTROSE 2-3 GM-% IV SOLR
INTRAVENOUS | Status: AC
  Filled 2014-08-14: qty 50

## 2014-08-14 MED ORDER — CHLORHEXIDINE GLUCONATE 4 % EX LIQD: 60.0000 mL | Freq: Once | CUTANEOUS | Status: DC

## 2014-08-14 MED ORDER — BISACODYL 10 MG RE SUPP
10.0000 mg | Freq: Every day | RECTAL | Status: DC | PRN
  Administered 2014-08-15: 10 mg via RECTAL
  Filled 2014-08-14: qty 1

## 2014-08-14 MED ORDER — DIPHENHYDRAMINE HCL 25 MG PO CAPS
25.0000 mg | ORAL_CAPSULE | Freq: Four times a day (QID) | ORAL | Status: DC | PRN
  Administered 2014-08-15 (×2): 25 mg via ORAL
  Filled 2014-08-14 (×2): qty 1

## 2014-08-14 MED ORDER — ONDANSETRON HCL 4 MG PO TABS: 4.0000 mg | ORAL_TABLET | Freq: Four times a day (QID) | ORAL | Status: DC | PRN

## 2014-08-14 MED ORDER — CEFAZOLIN SODIUM-DEXTROSE 2-3 GM-% IV SOLR
2.0000 g | Freq: Four times a day (QID) | INTRAVENOUS | Status: AC
  Administered 2014-08-14 – 2014-08-15 (×2): 2 g via INTRAVENOUS
  Filled 2014-08-14 (×2): qty 50

## 2014-08-14 MED ORDER — DOCUSATE SODIUM 100 MG PO CAPS
100.0000 mg | ORAL_CAPSULE | Freq: Two times a day (BID) | ORAL | Status: DC
  Administered 2014-08-14 – 2014-08-15 (×3): 100 mg via ORAL

## 2014-08-14 MED ORDER — TRANEXAMIC ACID 1000 MG/10ML IV SOLN
1000.0000 mg | Freq: Once | INTRAVENOUS | Status: AC
  Administered 2014-08-14: 1000 mg via INTRAVENOUS
  Filled 2014-08-14: qty 10

## 2014-08-14 MED ORDER — RIVAROXABAN 10 MG PO TABS
10.0000 mg | ORAL_TABLET | ORAL | Status: DC
Start: 2014-08-15 — End: 2014-08-16
  Administered 2014-08-15 – 2014-08-16 (×2): 10 mg via ORAL
  Filled 2014-08-14 (×3): qty 1

## 2014-08-14 SURGICAL SUPPLY — 42 items
BAG DECANTER FOR FLEXI CONT (MISCELLANEOUS) IMPLANT
BAG ZIPLOCK 12X15 (MISCELLANEOUS) ×3 IMPLANT
CAPT HIP TOTAL 2 ×3 IMPLANT
COVER PERINEAL POST (MISCELLANEOUS) ×3 IMPLANT
DRAPE C-ARM 42X120 X-RAY (DRAPES) ×3 IMPLANT
DRAPE STERI IOBAN 125X83 (DRAPES) ×3 IMPLANT
DRAPE U-SHAPE 47X51 STRL (DRAPES) ×9 IMPLANT
DRSG AQUACEL AG ADV 3.5X10 (GAUZE/BANDAGES/DRESSINGS) ×3 IMPLANT
DURAPREP 26ML APPLICATOR (WOUND CARE) ×3 IMPLANT
ELECT BLADE TIP CTD 4 INCH (ELECTRODE) ×3 IMPLANT
ELECT PENCIL ROCKER SW 15FT (MISCELLANEOUS) ×3 IMPLANT
ELECT REM PT RETURN 15FT ADLT (MISCELLANEOUS) ×3 IMPLANT
ELECT REM PT RETURN 9FT ADLT (ELECTROSURGICAL) ×3
ELECTRODE REM PT RTRN 9FT ADLT (ELECTROSURGICAL) ×1 IMPLANT
FACESHIELD WRAPAROUND (MASK) ×12 IMPLANT
GLOVE BIOGEL PI IND STRL 7.5 (GLOVE) ×1 IMPLANT
GLOVE BIOGEL PI IND STRL 8.5 (GLOVE) ×1 IMPLANT
GLOVE BIOGEL PI INDICATOR 7.5 (GLOVE) ×2
GLOVE BIOGEL PI INDICATOR 8.5 (GLOVE) ×2
GLOVE ECLIPSE 8.0 STRL XLNG CF (GLOVE) ×6 IMPLANT
GLOVE ORTHO TXT STRL SZ7.5 (GLOVE) ×3 IMPLANT
GOWN SPEC L3 XXLG W/TWL (GOWN DISPOSABLE) ×3 IMPLANT
GOWN STRL REUS W/TWL LRG LVL3 (GOWN DISPOSABLE) ×3 IMPLANT
HOLDER FOLEY CATH W/STRAP (MISCELLANEOUS) ×3 IMPLANT
KIT BASIN OR (CUSTOM PROCEDURE TRAY) ×3 IMPLANT
LIQUID BAND (GAUZE/BANDAGES/DRESSINGS) ×3 IMPLANT
NDL SAFETY ECLIPSE 18X1.5 (NEEDLE) IMPLANT
NEEDLE HYPO 18GX1.5 SHARP (NEEDLE)
PACK TOTAL JOINT (CUSTOM PROCEDURE TRAY) ×3 IMPLANT
PEN SKIN MARKING BROAD (MISCELLANEOUS) ×3 IMPLANT
SAW OSC TIP CART 19.5X105X1.3 (SAW) ×3 IMPLANT
SUT MNCRL AB 4-0 PS2 18 (SUTURE) ×3 IMPLANT
SUT VIC AB 1 CT1 36 (SUTURE) ×9 IMPLANT
SUT VIC AB 2-0 CT1 27 (SUTURE) ×4
SUT VIC AB 2-0 CT1 TAPERPNT 27 (SUTURE) ×2 IMPLANT
SUT VLOC 180 0 24IN GS25 (SUTURE) ×3 IMPLANT
SYR 50ML LL SCALE MARK (SYRINGE) IMPLANT
TOWEL OR 17X26 10 PK STRL BLUE (TOWEL DISPOSABLE) ×3 IMPLANT
TOWEL OR NON WOVEN STRL DISP B (DISPOSABLE) ×3 IMPLANT
TRAY FOLEY W/METER SILVER 14FR (SET/KITS/TRAYS/PACK) ×3 IMPLANT
WATER STERILE IRR 1500ML POUR (IV SOLUTION) ×3 IMPLANT
YANKAUER SUCT BULB TIP 10FT TU (MISCELLANEOUS) ×3 IMPLANT

## 2014-08-14 NOTE — Interval H&P Note (Signed)
History and Physical Interval Note:  08/14/2014 11:51 AM  Dawn Henderson  has presented today for surgery, with the diagnosis of RIGHT HIP OA  The various methods of treatment have been discussed with the patient and family. After consideration of risks, benefits and other options for treatment, the patient has consented to  Procedure(s): RIGHT TOTAL HIP ARTHROPLASTY ANTERIOR APPROACH (Right) as a surgical intervention .  The patient's history has been reviewed, patient examined, no change in status, stable for surgery.  I have reviewed the patient's chart and labs.  Questions were answered to the patient's satisfaction.     Shelda PalLIN, D

## 2014-08-14 NOTE — Anesthesia Postprocedure Evaluation (Signed)
  Anesthesia Post-op Note  Patient: Film/video editorGeneva Henderson  Procedure(s) Performed: Procedure(s) (LRB): RIGHT TOTAL HIP ARTHROPLASTY ANTERIOR APPROACH (Right)  Patient Location: PACU  Anesthesia Type: Spinal  Level of Consciousness: awake and alert   Airway and Oxygen Therapy: Patient Spontanous Breathing  Post-op Pain: mild  Post-op Assessment: Post-op Vital signs reviewed, Patient's Cardiovascular Status Stable, Respiratory Function Stable, Patent Airway and No signs of Nausea or vomiting. Spinal level has receded in PACU.  Last Vitals:  Filed Vitals:   08/14/14 1625  BP: 132/67  Pulse: 59  Temp: 36.7 C  Resp: 12    Post-op Vital Signs: stable   Complications: No apparent anesthesia complications

## 2014-08-14 NOTE — Op Note (Signed)
NAME:  Dawn Henderson                ACCOUNT NO.: [id number]      MEDICAL RECORD NO.: [id number]      FACILITY:  [facility]      PHYSICIAN:  Durene Romans D  DATE OF BIRTH:  1935/01/03     DATE OF PROCEDURE:  08/14/2014                                 OPERATIVE REPORT         PREOPERATIVE DIAGNOSIS: Right  hip osteoarthritis.      POSTOPERATIVE DIAGNOSIS:  Right hip osteoarthritis.      PROCEDURE:  Right total hip replacement through an anterior approach   utilizing DePuy THR system, component size 52mm pinnacle cup, a size 36+4 neutral   Altrex liner, a size 6 Hi Tri Lock stem with a 36+5 delta ceramic   ball.      SURGEON:  Madlyn Frankel. Charlann Boxer, M.D.      ASSISTANT:  Lanney Gins, PA-C      ANESTHESIA:  Spinal.      SPECIMENS:  None.      COMPLICATIONS:  None.      BLOOD LOSS:  250 cc     DRAINS:  None.      INDICATION OF THE PROCEDURE:  [name] is a 80 y.o. female who had   presented to office for evaluation of right hip pain.  Radiographs revealed   progressive degenerative changes with bone-on-bone   articulation to the  hip joint.  The patient had painful limited range of   motion significantly affecting their overall quality of life.  The patient was failing to    respond to conservative measures, and at this point was ready   to proceed with more definitive measures.  The patient has noted progressive   degenerative changes in his hip, progressive problems and dysfunction   with regarding the hip prior to surgery.  Consent was obtained for   benefit of pain relief.  Specific risk of infection, DVT, component   failure, dislocation, need for revision surgery, as well discussion of   the anterior versus posterior approach were reviewed.  Consent was   obtained for benefit of anterior pain relief through an anterior   approach.      PROCEDURE IN DETAIL:  The patient was brought to operative theater.   Once adequate anesthesia, preoperative  antibiotics, 2gm of Ancef, 1gm of Tranexamic Acis and 10mg  of Decadron administered.   The patient was positioned supine on the OSI Hanna table.  Once adequate   padding of boney process was carried out, we had predraped out the hip, and  used fluoroscopy to confirm orientation of the pelvis and position.      The right hip was then prepped and draped from proximal iliac crest to   mid thigh with shower curtain technique.      Time-out was performed identifying the patient, planned procedure, and   extremity.     An incision was then made 2 cm distal and lateral to the   anterior superior iliac spine extending over the orientation of the   tensor fascia lata muscle and sharp dissection was carried down to the   fascia of the muscle and protractor placed in the soft tissues.      The fascia was then incised.  The muscle belly was identified and swept   laterally and retractor placed along the superior neck.  Following   cauterization of the circumflex vessels and removing some pericapsular   fat, a second cobra retractor was placed on the inferior neck.  A third   retractor was placed on the anterior acetabulum after elevating the   anterior rectus.  A L-capsulotomy was along the line of the   superior neck to the trochanteric fossa, then extended proximally and   distally.  Tag sutures were placed and the retractors were then placed   intracapsular.  We then identified the trochanteric fossa and   orientation of my neck cut, confirmed this radiographically   and then made a neck osteotomy with the femur on traction.  The femoral   head was removed without difficulty or complication.  Traction was let   off and retractors were placed posterior and anterior around the   acetabulum.      The labrum and foveal tissue were debrided.  I began reaming with a 47mm   reamer and reamed up to 51mm reamer with good bony bed preparation and a 52mm   cup was chosen.  The final 52mm Pinnacle cup  was then impacted under fluoroscopy  to confirm the depth of penetration and orientation with respect to   abduction.  A screw was placed followed by the hole eliminator.  The final   36+4 neutral Altrex liner was impacted with good visualized rim fit.  The cup was positioned anatomically within the acetabular portion of the pelvis.      At this point, the femur was rolled at 80 degrees.  Further capsule was   released off the inferior aspect of the femoral neck.  I then   released the superior capsule proximally.  The hook was placed laterally   along the femur and elevated manually and held in position with the bed   hook.  The leg was then extended and adducted with the leg rolled to 100   degrees of external rotation.  Once the proximal femur was fully   exposed, I used a box osteotome to set orientation.  I then began   broaching with the starting chili pepper broach and passed this by hand and then broached up to 6.  With the 6 broach in place I chose a high offset neck and did a trial reduction.  The offset was appropriate, leg lengths   appeared to be equal, confirmed radiographically.   Given these findings, I went ahead and dislocated the hip, repositioned all   retractors and positioned the right hip in the extended and abducted position.  The final 6 Hi Tri Lock stem was   chosen and it was impacted down to the level of neck cut.  Based on this   and the trial reduction, a 36+5 delta ceramic ball was chosen and   impacted onto a clean and dry trunnion, and the hip was reduced.  My plan will be to match her left hip to this right one once she schedules to have it done due to advanced arthritis on that side. The   hip had been irrigated throughout the case again at this point.  I did   reapproximate the superior capsular leaflet to the anterior leaflet   using #1 Vicryl.  The fascia of the   tensor fascia lata muscle was then reapproximated using #1 Vicryl and #0 V-lock sutures.  The    remaining wound  was closed with 2-0 Vicryl and running 4-0 Monocryl.   The hip was cleaned, dried, and dressed sterilely using Dermabond and   Aquacel dressing.  She was then brought   to recovery room in stable condition tolerating the procedure well.    Lanney Gins, PA-C was present for the entirety of the case involved from   preoperative positioning, perioperative retractor management, general   facilitation of the case, as well as primary wound closure as assistant.            Madlyn Frankel Charlann Boxer, M.D.        08/14/2014 2:18 PM

## 2014-08-14 NOTE — Anesthesia Preprocedure Evaluation (Signed)
Anesthesia Evaluation  Patient identified by MRN, date of birth, ID band Patient awake    Reviewed: Allergy & Precautions, NPO status , Patient's Chart, lab work & pertinent test results  Airway Mallampati: II  TM Distance: >3 FB Neck ROM: Full    Dental no notable dental hx.    Pulmonary neg pulmonary ROS,  breath sounds clear to auscultation  Pulmonary exam normal       Cardiovascular Exercise Tolerance: Good + Peripheral Vascular Disease Normal cardiovascular exam+ dysrhythmias Supra Ventricular Tachycardia + pacemaker + Valvular Problems/Murmurs MR Rhythm:Regular Rate:Normal  H/O Sick Sinus Syndrome, H/O SVT   Neuro/Psych negative neurological ROS  negative psych ROS   GI/Hepatic Neg liver ROS, hiatal hernia,   Endo/Other  negative endocrine ROS  Renal/GU negative Renal ROS  negative genitourinary   Musculoskeletal  (+) Arthritis -,   Abdominal   Peds negative pediatric ROS (+)  Hematology negative hematology ROS (+)   Anesthesia Other Findings   Reproductive/Obstetrics negative OB ROS                             Anesthesia Physical Anesthesia Plan  ASA: III  Anesthesia Plan: Spinal   Post-op Pain Management:    Induction: Intravenous  Airway Management Planned:   Additional Equipment:   Intra-op Plan:   Post-operative Plan: Extubation in OR  Informed Consent: I have reviewed the patients History and Physical, chart, labs and discussed the procedure including the risks, benefits and alternatives for the proposed anesthesia with the patient or authorized representative who has indicated his/her understanding and acceptance.   Dental advisory given  Plan Discussed with: CRNA  Anesthesia Plan Comments: (Discussed risks and benefits of and differences between spinal and general. Discussed risks of spinal including headache, backache, failure, bleeding, infection, and nerve  damage. Patient consents to spinal. Questions answered. Coagulation studies and platelet count acceptable.)        Anesthesia Quick Evaluation

## 2014-08-14 NOTE — Transfer of Care (Signed)
Immediate Anesthesia Transfer of Care Note  Patient: Film/video editorGeneva Henderson  Procedure(s) Performed: Procedure(s): RIGHT TOTAL HIP ARTHROPLASTY ANTERIOR APPROACH (Right)  Patient Location: PACU  Anesthesia Type:SpinalL1  Level of Consciousness:  sedated, patient cooperative and responds to stimulation  Airway & Oxygen Therapy:Patient Spontanous Breathing and Patient connected to face mask oxgen  Post-op Assessment:  Report given to PACU RN and Post -op Vital signs reviewed and stable  Post vital signs:  Reviewed and stable  Last Vitals:  Filed Vitals:   08/14/14 1015  BP: 149/69  Pulse: 60  Temp: 36.7 C  Resp: 20    Complications: No apparent anesthesia complications

## 2014-08-14 NOTE — Anesthesia Procedure Notes (Signed)
Spinal Patient location during procedure: OR Start time: 08/14/2014 1:05 PM End time: 08/14/2014 1:15 PM Staffing Resident/CRNA: ,  Performed by: resident/CRNA  Preanesthetic Checklist Completed: patient identified, site marked, surgical consent, pre-op evaluation, timeout performed, IV checked, risks and benefits discussed and monitors and equipment checked Spinal Block Patient position: sitting Prep: Betadine Patient monitoring: heart rate, cardiac monitor, continuous pulse ox and blood pressure Approach: midline Location: L3-4 Injection technique: single-shot Needle Needle type: Spinocan  Needle gauge: 22 G Needle length: 9 cm Needle insertion depth: 7 cm Assessment Sensory level: T6 Additional Notes -para, -heme, pt tol well, VSS.  Lot [id number] Exp  2017-10

## 2014-08-15 ENCOUNTER — Encounter (HOSPITAL_COMMUNITY): Payer: Self-pay | Admitting: Orthopedic Surgery

## 2014-08-15 DIAGNOSIS — E663 Overweight: Secondary | ICD-10-CM | POA: Diagnosis present

## 2014-08-15 LAB — CBC
HEMATOCRIT: 36.1 % (ref 36.0–46.0)
Hemoglobin: 12.2 g/dL (ref 12.0–15.0)
MCH: 33.6 pg (ref 26.0–34.0)
MCHC: 33.8 g/dL (ref 30.0–36.0)
MCV: 99.4 fL (ref 78.0–100.0)
Platelets: 115 10*3/uL — ABNORMAL LOW (ref 150–400)
RBC: 3.63 MIL/uL — ABNORMAL LOW (ref 3.87–5.11)
RDW: 12.2 % (ref 11.5–15.5)
WBC: 7.4 10*3/uL (ref 4.0–10.5)

## 2014-08-15 LAB — BASIC METABOLIC PANEL
Anion gap: 6 (ref 5–15)
BUN: 16 mg/dL (ref 6–20)
CHLORIDE: 107 mmol/L (ref 101–111)
CO2: 28 mmol/L (ref 22–32)
CREATININE: 0.99 mg/dL (ref 0.44–1.00)
Calcium: 9 mg/dL (ref 8.9–10.3)
GFR calc Af Amer: >60 mL/min (ref >60)
GFR calc non Af Amer: 53 mL/min — ABNORMAL LOW (ref >60)
Glucose, Bld: 182 mg/dL — ABNORMAL HIGH (ref 65–99)
POTASSIUM: 5.1 mmol/L (ref 3.5–5.1)
Sodium: 141 mmol/L (ref 135–145)

## 2014-08-15 MED ORDER — RIVAROXABAN 10 MG PO TABS: 10.0000 mg | ORAL_TABLET | ORAL | Status: DC

## 2014-08-15 MED ORDER — HYDROCODONE-ACETAMINOPHEN 7.5-325 MG PO TABS: 1.0000 | ORAL_TABLET | ORAL | Status: DC | PRN

## 2014-08-15 MED ORDER — FERROUS SULFATE 325 (65 FE) MG PO TABS: 325.0000 mg | ORAL_TABLET | Freq: Three times a day (TID) | ORAL | Status: DC

## 2014-08-15 MED ORDER — TIZANIDINE HCL 4 MG PO TABS: 4.0000 mg | ORAL_TABLET | Freq: Four times a day (QID) | ORAL | Status: DC | PRN

## 2014-08-15 MED ORDER — DOCUSATE SODIUM 100 MG PO CAPS: 100.0000 mg | ORAL_CAPSULE | Freq: Two times a day (BID) | ORAL | Status: DC

## 2014-08-15 MED ORDER — ASPIRIN EC 325 MG PO TBEC
325.0000 mg | DELAYED_RELEASE_TABLET | Freq: Two times a day (BID) | ORAL | Status: AC
Start: 2014-08-30 — End: 2014-09-29

## 2014-08-15 MED ORDER — HYDROMORPHONE HCL 1 MG/ML IJ SOLN: 0.2500 mg | INTRAMUSCULAR | Status: DC | PRN

## 2014-08-15 MED ORDER — POLYETHYLENE GLYCOL 3350 17 G PO PACK: 17.0000 g | PACK | Freq: Two times a day (BID) | ORAL | Status: DC

## 2014-08-15 NOTE — Progress Notes (Signed)
Patient feels as though the Colace pill got stuck in her throat. Finished taking pills with applesauce with no difficulty. No difficulties with eating, drinking or speaking. No complaints of pain but is complaining of itching across her trunk, anterior and posterior. Administered Benadryl . Will continue to monitor.

## 2014-08-15 NOTE — Progress Notes (Deleted)
OT  Note  Patient Details Name: Dawn Henderson MRN: [id number] DOB: 1934/06/06   Cancelled Treatment: Spoke with pt. Obtained pt crackers to A with feeling sick. Pt does not feel OT services are needed at this time, Husband in agreement . Will sign off , Dorena BodoLorraine D  [place] , [place] [phone] 08/15/2014, 12:35 PM

## 2014-08-15 NOTE — Progress Notes (Signed)
Pt alert x4, HR 130- 140's, no c/o chest pain. EKG done SVT, made Mat PA aware, no orders given at this time. Will monitor. Estill DoomsSimon,  Mahario, RN 7:38 AM 08/15/2014

## 2014-08-15 NOTE — Progress Notes (Signed)
OT  Note  Patient Details Name: Levonne HubertGeneva Lineberry MRN: [id number] DOB: Sep 11, 1934  Pt exhausted s/p PT. Do not feel pt can leave today as pts caregiver is a 9 th grader.  Will perform OT eval later in day or next day as pt able.    Lise AuerLori , [place] [phone]  Einar CrowEDDING,  D 08/15/2014, 12:24 PM

## 2014-08-15 NOTE — Progress Notes (Signed)
Physical Therapy Treatment Patient Details Name: Ailah Barna MRN: [id number] DOB: [birth year] Today's Date: 08/15/2014    History of Present Illness R THR; hx of pacemaker    PT Comments    Marked improvement in activity tolerance and with decreased level of assist needed.  Follow Up Recommendations  Home health PT     Equipment Recommendations  None recommended by PT    Recommendations for Other Services OT consult     Precautions / Restrictions Precautions Precautions: Fall Restrictions Weight Bearing Restrictions: No Other Position/Activity Restrictions: WBAT    Mobility  Bed Mobility Overal bed mobility: Needs Assistance;+2 for physical assistance Bed Mobility: Sit to Supine     Supine to sit: Min assist;Mod assist;+2 for physical assistance Sit to supine: Min assist   General bed mobility comments: cues for sequence and use of L LE to self assist.  Physical assist to manage R LE and bring trunk to upright  Transfers Overall transfer level: Needs assistance Equipment used: Rolling walker (2 wheeled) Transfers: Sit to/from Stand Sit to Stand: Min assist;Mod assist         General transfer comment: cues for LE management and use of UEs to self assist  Ambulation/Gait Ambulation/Gait assistance: Min assist Ambulation Distance (Feet): 111 Feet Assistive device: Rolling walker (2 wheeled) Gait Pattern/deviations: Step-to pattern;Decreased step length - right;Decreased step length - left;Shuffle;Trunk flexed;Wide base of support Gait velocity: decr   General Gait Details: cues for posture, sequence, position from RW and to decrease BOS   Stairs            Wheelchair Mobility    Modified Rankin (Stroke Patients Only)       Balance                                    Cognition Arousal/Alertness: Awake/alert Behavior During Therapy: WFL for tasks assessed/performed Overall Cognitive Status: Within Functional Limits for tasks  assessed                      Exercises Total Joint Exercises Ankle Circles/Pumps: AROM;Both;15 reps;Supine Quad Sets: AROM;Both;10 reps;Supine Heel Slides: AAROM;Right;15 reps;Supine Hip ABduction/ADduction: AAROM;Right;10 reps;Supine    General Comments        Pertinent Vitals/Pain Pain Assessment: 0-10 Pain Score: 4  Pain Location: R hip Pain Descriptors / Indicators: Aching;Sore Pain Intervention(s): Limited activity within patient's tolerance;Monitored during session;Premedicated before session;Ice applied    Home Living Family/patient expects to be discharged to:: Private residence Living Arrangements: Other relatives Available Help at Discharge: Available 24 hours/day Type of Home: House Home Access: Stairs to enter   Home Layout: Able to live on main level with bedroom/bathroom Home Equipment: Walker - 2 wheels;Walker - 4 wheels;Bedside commode      Prior Function Level of Independence: Independent          PT Goals (current goals can now be found in the care plan section) Acute Rehab PT Goals Patient Stated Goal: Resume previous lifestyle with decreased pain PT Goal Formulation: With patient Time For Goal Achievement: 08/22/14 Potential to Achieve Goals: Good Progress towards PT goals: Progressing toward goals    Frequency  7X/week    PT Plan Current plan remains appropriate    Co-evaluation             End of Session Equipment Utilized During Treatment: Gait belt Activity Tolerance: Patient tolerated treatment well Patient left: in bed;with  call bell/phone within reach;with family/visitor present     Time: [id number] PT Time Calculation (min) (ACUTE ONLY): 26 min  Charges:  $Gait Training: 23-37 mins $Therapeutic Exercise: 8-22 mins                    G Codes:      , 08/15/2014, 3:24 PM

## 2014-08-15 NOTE — Evaluation (Signed)
Physical Therapy Evaluation Patient Details Name: Dawn Henderson MRN: [id number] DOB: 1934-07-15 Today's Date: 08/15/2014   History of Present Illness  R THR; hx of pacemaker  Clinical Impression  Pt s/p R THR presents with decreased R LE strength/ROM and post op pain limiting functional mobility.  Pt should progress to dc home with family assist and HHPT follow up.    Follow Up Recommendations Home health PT    Equipment Recommendations  None recommended by PT    Recommendations for Other Services OT consult     Precautions / Restrictions Precautions Precautions: Fall Restrictions Weight Bearing Restrictions: No Other Position/Activity Restrictions: WBAT      Mobility  Bed Mobility Overal bed mobility: Needs Assistance;+2 for physical assistance Bed Mobility: Supine to Sit     Supine to sit: Min assist;Mod assist;+2 for physical assistance     General bed mobility comments: cues for sequence and use of L LE to self assist.  Physical assist to manage R LE and bring trunk to upright  Transfers Overall transfer level: Needs assistance Equipment used: Rolling walker (2 wheeled) Transfers: Sit to/from Stand Sit to Stand: Min assist;+2 physical assistance;+2 safety/equipment         General transfer comment: cues for LE management and use of UEs to self assist  Ambulation/Gait Ambulation/Gait assistance: Min assist;Mod assist;+2 safety/equipment Ambulation Distance (Feet): 16 Feet Assistive device: Rolling walker (2 wheeled) Gait Pattern/deviations: Step-to pattern;Decreased step length - right;Decreased step length - left;Shuffle;Wide base of support Gait velocity: decr   General Gait Details: cues for posture, sequence, position from RW and to decrease BOS  Stairs            Wheelchair Mobility    Modified Rankin (Stroke Patients Only)       Balance                                             Pertinent Vitals/Pain Pain  Assessment: 0-10 Pain Score: 5  Pain Location: R hip Pain Descriptors / Indicators: Aching;Sore Pain Intervention(s): Limited activity within patient's tolerance;Monitored during session;Premedicated before session;Ice applied    Home Living Family/patient expects to be discharged to:: Private residence Living Arrangements: Other relatives Available Help at Discharge: Available 24 hours/day Type of Home: House Home Access: Stairs to enter   [employer] Corporation[employer]trance Stairs-Number of Steps: 1 Home Layout: Able to live on main level with bedroom/bathroom Home Equipment: Walker - 2 wheels;Walker - 4 wheels;Bedside commode      Prior Function Level of Independence: Independent               Hand Dominance   Dominant Hand: Right    Extremity/Trunk Assessment   Upper Extremity Assessment: Generalized weakness           Lower Extremity Assessment: RLE deficits/detail RLE Deficits / Details: 2+/5 hip strength with AAROM at hip to 80 flex and 15 abd    Cervical / Trunk Assessment: Normal  Communication   Communication: No difficulties  Cognition Arousal/Alertness: Awake/alert Behavior During Therapy: WFL for tasks assessed/performed Overall Cognitive Status: Within Functional Limits for tasks assessed                      General Comments      Exercises Total Joint Exercises Ankle Circles/Pumps: AROM;Both;15 reps;Supine Quad Sets: AROM;Both;10 reps;Supine Heel Slides: AAROM;Right;15 reps;Supine Hip ABduction/ADduction: AAROM;Right;10 reps;Supine  Assessment/Plan    PT Assessment Patient needs continued PT services  PT Diagnosis Difficulty walking   PT Problem List Decreased range of motion;Decreased strength;Decreased activity tolerance;Decreased balance;Decreased mobility;Decreased knowledge of use of DME;Obesity;Pain  PT Treatment Interventions DME instruction;Gait training;Stair training;Functional mobility training;Therapeutic activities;Therapeutic  exercise;Patient/family education   PT Goals (Current goals can be found in the Care Plan section) Acute Rehab PT Goals Patient Stated Goal: Resume previous lifestyle with decreased pain PT Goal Formulation: With patient Time For Goal Achievement: 08/22/14 Potential to Achieve Goals: Good    Frequency 7X/week   Barriers to discharge        Co-evaluation               End of Session Equipment Utilized During Treatment: Gait belt Activity Tolerance: Patient tolerated treatment well Patient left: in chair;with call bell/phone within reach;with family/visitor present Nurse Communication: Mobility status         Time: 1610-9604 PT Time Calculation (min) (ACUTE ONLY): 33 min   Charges:   PT Evaluation $Initial PT Evaluation Tier I: 1 Procedure PT Treatments $Therapeutic Exercise: 8-22 mins   PT G Codes:        , 2014-08-19, 1:09 PM

## 2014-08-15 NOTE — Progress Notes (Signed)
Utilization review completed.  

## 2014-08-15 NOTE — Discharge Instructions (Addendum)
INSTRUCTIONS AFTER JOINT REPLACEMENT  ° °o Remove items at home which could result in a fall. This includes throw rugs or furniture in walking pathways °o ICE to the affected joint every three hours while awake for 30 minutes at a time, for at least the first 3-5 days, and then as needed for pain and swelling.  Continue to use ice for pain and swelling. You may notice swelling that will progress down to the foot and ankle.  This is normal after surgery.  Elevate your leg when you are not up walking on it.   °o Continue to use the breathing machine you got in the hospital (incentive spirometer) which will help keep your temperature down.  It is common for your temperature to cycle up and down following surgery, especially at night when you are not up moving around and exerting yourself.  The breathing machine keeps your lungs expanded and your temperature down. ° ° °DIET:  As you were doing prior to hospitalization, we recommend a well-balanced diet. ° °DRESSING / WOUND CARE / SHOWERING ° °Keep the surgical dressing until follow up.  The dressing is water proof, so you can shower without any extra covering.  IF THE DRESSING FALLS OFF or the wound gets wet inside, change the dressing with sterile gauze.  Please use good hand washing techniques before changing the dressing.  Do not use any lotions or creams on the incision until instructed by your surgeon.   ° °ACTIVITY ° °o Increase activity slowly as tolerated, but follow the weight bearing instructions below.   °o No driving for 6 weeks or until further direction given by your physician.  You cannot drive while taking narcotics.  °o No lifting or carrying greater than 10 lbs. until further directed by your surgeon. °o Avoid periods of inactivity such as sitting longer than an hour when not asleep. This helps prevent blood clots.  °o You may return to work once you are authorized by your doctor.  ° ° ° °WEIGHT BEARING  ° °Weight bearing as tolerated with assist  device (walker, cane, etc) as directed, use it as long as suggested by your surgeon or therapist, typically at least 4-6 weeks. ° ° °EXERCISES ° °Results after joint replacement surgery are often greatly improved when you follow the exercise, range of motion and muscle strengthening exercises prescribed by your doctor. Safety measures are also important to protect the joint from further injury. Any time any of these exercises cause you to have increased pain or swelling, decrease what you are doing until you are comfortable again and then slowly increase them. If you have problems or questions, call your caregiver or physical therapist for advice.  ° °Rehabilitation is important following a joint replacement. After just a few days of immobilization, the muscles of the leg can become weakened and shrink (atrophy).  These exercises are designed to build up the tone and strength of the thigh and leg muscles and to improve motion. Often times heat used for twenty to thirty minutes before working out will loosen up your tissues and help with improving the range of motion but do not use heat for the first two weeks following surgery (sometimes heat can increase post-operative swelling).  ° °These exercises can be done on a training (exercise) mat, on the floor, on a table or on a bed. Use whatever works the best and is most comfortable for you.    Use music or television while you are exercising so that   the exercises are a pleasant break in your day. This will make your life better with the exercises acting as a break in your routine that you can look forward to.   Perform all exercises about fifteen times, three times per day or as directed.  You should exercise both the operative leg and the other leg as well. ° °Exercises include: °  °• Quad Sets - Tighten up the muscle on the front of the thigh (Quad) and hold for 5-10 seconds.   °• Straight Leg Raises - With your knee straight (if you were given a brace, keep it on),  lift the leg to 60 degrees, hold for 3 seconds, and slowly lower the leg.  Perform this exercise against resistance later as your leg gets stronger.  °• Leg Slides: Lying on your back, slowly slide your foot toward your buttocks, bending your knee up off the floor (only go as far as is comfortable). Then slowly slide your foot back down until your leg is flat on the floor again.  °• Angel Wings: Lying on your back spread your legs to the side as far apart as you can without causing discomfort.  °• Hamstring Strength:  Lying on your back, push your heel against the floor with your leg straight by tightening up the muscles of your buttocks.  Repeat, but this time bend your knee to a comfortable angle, and push your heel against the floor.  You may put a pillow under the heel to make it more comfortable if necessary.  ° °A rehabilitation program following joint replacement surgery can speed recovery and prevent re-injury in the future due to weakened muscles. Contact your doctor or a physical therapist for more information on knee rehabilitation.  ° ° °CONSTIPATION ° °Constipation is defined medically as fewer than three stools per week and severe constipation as less than one stool per week.  Even if you have a regular bowel pattern at home, your normal regimen is likely to be disrupted due to multiple reasons following surgery.  Combination of anesthesia, postoperative narcotics, change in appetite and fluid intake all can affect your bowels.  ° °YOU MUST use at least one of the following options; they are listed in order of increasing strength to get the job done.  They are all available over the counter, and you may need to use some, POSSIBLY even all of these options:   ° °Drink plenty of fluids (prune juice may be helpful) and high fiber foods °Colace 100 mg by mouth twice a day  °Senokot for constipation as directed and as needed Dulcolax (bisacodyl), take with full glass of water  °Miralax (polyethylene glycol)  once or twice a day as needed. ° °If you have tried all these things and are unable to have a bowel movement in the first 3-4 days after surgery call either your surgeon or your primary doctor.   ° °If you experience loose stools or diarrhea, hold the medications until you stool forms back up.  If your symptoms do not get better within 1 week or if they get worse, check with your doctor.  If you experience "the worst abdominal pain ever" or develop nausea or vomiting, please contact the office immediately for further recommendations for treatment. ° ° °ITCHING:  If you experience itching with your medications, try taking only a single pain pill, or even half a pain pill at a time.  You can also use Benadryl over the counter for itching or also to   help with sleep.  ° °TED HOSE STOCKINGS:  Use stockings on both legs until for at least 2 weeks or as directed by physician office. They may be removed at night for sleeping. ° °MEDICATIONS:  See your medication summary on the “After Visit Summary” that nursing will review with you.  You may have some home medications which will be placed on hold until you complete the course of blood thinner medication.  It is important for you to complete the blood thinner medication as prescribed. ° °PRECAUTIONS:  If you experience chest pain or shortness of breath - call 911 immediately for transfer to the hospital emergency department.  ° °If you develop a fever greater that 101 F, purulent drainage from wound, increased redness or drainage from wound, foul odor from the wound/dressing, or calf pain - CONTACT YOUR SURGEON.   °                                                °FOLLOW-UP APPOINTMENTS:  If you do not already have a post-op appointment, please call the office for an appointment to be seen by your surgeon.  Guidelines for how soon to be seen are listed in your “After Visit Summary”, but are typically between 1-4 weeks after surgery. ° °OTHER INSTRUCTIONS:  ° °Knee  Replacement:  Do not place pillow under knee, focus on keeping the knee straight while resting.  ° °MAKE SURE YOU:  °• Understand these instructions.  °• Get help right away if you are not doing well or get worse.  ° ° °Thank you for letting us be a part of your medical care team.  It is a privilege we respect greatly.  We hope these instructions will help you stay on track for a fast and full recovery!  °  ° °Information on my medicine - XARELTO® (Rivaroxaban) ° °This medication education was reviewed with me or my healthcare representative as part of my discharge preparation.  The pharmacist that spoke with me during my hospital stay was:  ,  E, RPH ° °Why was Xarelto® prescribed for you? °Xarelto® was prescribed for you to reduce the risk of blood clots forming after orthopedic surgery. The medical term for these abnormal blood clots is venous thromboembolism (VTE). ° °What do you need to know about xarelto® ? °Take your Xarelto® ONCE DAILY at the same time every day. °You may take it either with or without food. ° °If you have difficulty swallowing the tablet whole, you may crush it and mix in applesauce just prior to taking your dose. ° °Take Xarelto® exactly as prescribed by your doctor and DO NOT stop taking Xarelto® without talking to the doctor who prescribed the medication.  Stopping without other VTE prevention medication to take the place of Xarelto® may increase your risk of developing a clot. ° °After discharge, you should have regular check-up appointments with your healthcare provider that is prescribing your Xarelto®.   ° °What do you do if you miss a dose? °If you miss a dose, take it as soon as you remember on the same day then continue your regularly scheduled once daily regimen the next day. Do not take two doses of Xarelto® on the same day.  ° °Important Safety Information °A possible side effect of Xarelto® is bleeding. You should call your healthcare provider right away if   you  experience any of the following: °? Bleeding from an injury or your nose that does not stop. °? Unusual colored urine (red or dark brown) or unusual colored stools (red or black). °? Unusual bruising for unknown reasons. °? A serious fall or if you hit your head (even if there is no bleeding). ° °Some medicines may interact with Xarelto® and might increase your risk of bleeding while on Xarelto®. To help avoid this, consult your healthcare provider or pharmacist prior to using any new prescription or non-prescription medications, including herbals, vitamins, non-steroidal anti-inflammatory drugs (NSAIDs) and supplements. ° °This website has more information on Xarelto®: www.xarelto.com. ° ° ° °

## 2014-08-15 NOTE — Progress Notes (Signed)
     Subjective: 1 Day Post-Op Procedure(s) (LRB): RIGHT TOTAL HIP ARTHROPLASTY ANTERIOR APPROACH (Right)   Patient reports pain as mild, pain controlled. Other than increased HR, no events throughout the night.  She does have occasional tachycardia episodes, which resolve over time.  Ready to be discharged home if she does well with PT.  Objective:   VITALS:   Filed Vitals:   08/15/14 0650  BP: 100/75  Pulse: 135  Temp: 98.2 F (36.8 C)  Resp: 14    Dorsiflexion/Plantar flexion intact Incision: dressing C/D/I No cellulitis present Compartment soft  LABS  Recent Labs  08/15/14 0500  HGB 12.2  HCT 36.1  WBC 7.4  PLT 115*     Recent Labs  08/15/14 0500  NA 141  K 5.1  BUN 16  CREATININE 0.99  GLUCOSE 182*     Assessment/Plan: 1 Day Post-Op Procedure(s) (LRB): RIGHT TOTAL HIP ARTHROPLASTY ANTERIOR APPROACH (Right) Discussed following up with her cardiologist if the HR stays increase. She is already on Xarelto and has a pacemaker. She states that she does happen and eventually goes away, never been told to add an additional Metropolol Advance diet Up with therapy D/C IV fluids Discharge home with home health  [facility]. Follow up with OLIN, D in 2 weeks.  Contact information:  [facility] [address], [address] [place] [id number] [phone]    Overweight (BMI 25-29.9) Estimated body mass index is 27.72 kg/(m^2) as calculated from the following:   Height as of this encounter: 5\' 7"  (1.702 m).   Weight as of this encounter: 80.287 kg (177 lb). Patient also counseled that weight may inhibit the healing process Patient counseled that losing weight will help with future health issues         Anastasio AuerbachMatthew S.    PAC  08/15/2014, 9:44 AM

## 2014-08-15 NOTE — Care Management Note (Signed)
Case Management Note  Patient Details  Name: Dawn Henderson MRN: [id number] Date of Birth: Apr 12, 1934  Subjective/Objective:                   RIGHT TOTAL HIP ARTHROPLASTY ANTERIOR APPROACH (Right) Action/Plan:  Discharge planning Expected Discharge Date:  08/15/14               Expected Discharge Plan:  [place]  In-House Referral:     Discharge planning Services  CM Consult  Post Acute Care Choice:  Home Health Choice offered to:  Patient  DME Arranged:    DME Agency:     HH Arranged:  PT [place]:  Other - See comment  Status of Service:  Completed, signed off  Medicare Important Message Given:    Date Medicare IM Given:    Medicare IM give by:    Date Additional Medicare IM Given:    Additional Medicare Important Message give by:     If discussed at [place] of Stay Meetings, dates discussed:    Additional Comments: CM met with pt in room to offer choice. Pt states she has no preference.  CM called [facility] in [place] [phone] and spoke with rep who requested I fax facesheet, orders, F2F, H&P, OP note and progress note to [phone]. CM faxed requested information.  No DME is needed as pt states she has a rolling walker and commode at home.  No other CM needs were communicated. Dellie Catholic, RN 08/15/2014, 12:06 PM

## 2014-08-16 LAB — BASIC METABOLIC PANEL
ANION GAP: 7 (ref 5–15)
BUN: 21 mg/dL — AB (ref 6–20)
CHLORIDE: 102 mmol/L (ref 101–111)
CO2: 29 mmol/L (ref 22–32)
CREATININE: 0.89 mg/dL (ref 0.44–1.00)
Calcium: 9.3 mg/dL (ref 8.9–10.3)
GFR calc non Af Amer: >60 mL/min (ref >60)
GLUCOSE: 164 mg/dL — AB (ref 65–99)
Potassium: 4.1 mmol/L (ref 3.5–5.1)
Sodium: 138 mmol/L (ref 135–145)

## 2014-08-16 LAB — CBC
HCT: 37.4 % (ref 36.0–46.0)
HEMOGLOBIN: 12.6 g/dL (ref 12.0–15.0)
MCH: 33.7 pg (ref 26.0–34.0)
MCHC: 33.7 g/dL (ref 30.0–36.0)
MCV: 100 fL (ref 78.0–100.0)
Platelets: 128 10*3/uL — ABNORMAL LOW (ref 150–400)
RBC: 3.74 MIL/uL — ABNORMAL LOW (ref 3.87–5.11)
RDW: 12.5 % (ref 11.5–15.5)
WBC: 8.9 10*3/uL (ref 4.0–10.5)

## 2014-08-16 NOTE — Care Management (Signed)
Important Message  Patient Details  Name: Dawn Henderson MRN: [id number] Date of Birth: 05/19/1934   Medicare Important Message Given:  Yes-second notification given    Renie OraHawkins,  Smith, LCSW 08/16/2014, 3:43 PM

## 2014-08-16 NOTE — Progress Notes (Signed)
     Subjective: 2 Days Post-Op Procedure(s) (LRB): RIGHT TOTAL HIP ARTHROPLASTY ANTERIOR APPROACH (Right)   Patient reports pain as mild, pain controlled. No events throughout the night. Feels that she is doing well with PT. Ready to be discharged home.  Objective:   VITALS:   Filed Vitals:   08/16/14 0526  BP: 133/71  Pulse: 60  Temp: 98.3 F (36.8 C)  Resp: 16    Dorsiflexion/Plantar flexion intact Incision: dressing C/D/I No cellulitis present Compartment soft  LABS  Recent Labs  08/15/14 0500 08/16/14 0515  HGB 12.2 12.6  HCT 36.1 37.4  WBC 7.4 8.9  PLT 115* 128*     Recent Labs  08/15/14 0500 08/16/14 0515  NA 141 138  K 5.1 4.1  BUN 16 21*  CREATININE 0.99 0.89  GLUCOSE 182* 164*     Assessment/Plan: 2 Days Post-Op Procedure(s) (LRB): RIGHT TOTAL HIP ARTHROPLASTY ANTERIOR APPROACH (Right) Up with therapy Discharge home with home health  [facility]. Follow up with OLIN, D in 2 weeks.  Contact information:  [facility] [address] [place] [id number] [phone]    Overweight (BMI 25-29.9) Estimated body mass index is 27.72 kg/(m^2) as calculated from the following:   Height as of this encounter: 5\' 7"  (1.702 m).   Weight as of this encounter: 80.287 kg (177 lb). Patient also counseled that weight may inhibit the healing process Patient counseled that losing weight will help with future health issues        Dawn AuerbachMatthew S.    PAC  08/16/2014, 8:41 AM

## 2014-08-16 NOTE — Progress Notes (Signed)
Physical Therapy Treatment Patient Details Name: Dawn Henderson MRN: [id number] DOB: Aug 01, 1934 Today's Date: 08/16/2014    History of Present Illness R THR; hx of pacemaker    PT Comments    Progressing well and eager for return home.  Reviewed car transfers, bed mobility and stairs with pt.  Follow Up Recommendations  Home health PT     Equipment Recommendations  None recommended by PT    Recommendations for Other Services OT consult     Precautions / Restrictions Precautions Precautions: Fall Restrictions Weight Bearing Restrictions: No Other Position/Activity Restrictions: WBAT    Mobility  Bed Mobility Overal bed mobility: Needs Assistance Bed Mobility: Sit to Supine       Sit to supine: Min guard   General bed mobility comments: used leg lifter, extra time, cues for technique  Transfers Overall transfer level: Needs assistance Equipment used: Rolling walker (2 wheeled) Transfers: Sit to/from Stand Sit to Stand: Supervision         General transfer comment: cues for UE placement  Ambulation/Gait Ambulation/Gait assistance: Min guard;Supervision Ambulation Distance (Feet): 130 Feet Assistive device: Rolling walker (2 wheeled) Gait Pattern/deviations: Step-to pattern;Step-through pattern;Decreased step length - right;Decreased step length - left;Shuffle;Trunk flexed Gait velocity: decr   General Gait Details: cues for posture, sequence, position from RW and to decrease BOS   Stairs Stairs: Yes Stairs assistance: Min assist Stair Management: No rails;Step to pattern;Forwards;With walker Number of Stairs: 1 (single step twice) General stair comments: cues for sequence and foot/RW placement; nephew observing  Wheelchair Mobility    Modified Rankin (Stroke Patients Only)       Balance                                    Cognition Arousal/Alertness: Awake/alert Behavior During Therapy: WFL for tasks assessed/performed Overall  Cognitive Status: Within Functional Limits for tasks assessed                      Exercises      General Comments        Pertinent Vitals/Pain Pain Assessment: 0-10 Pain Score: 5  Pain Location: R hip Pain Descriptors / Indicators: Aching;Burning Pain Intervention(s): Limited activity within patient's tolerance;Monitored during session;Premedicated before session;Ice applied    Home Living                      Prior Function            PT Goals (current goals can now be found in the care plan section) Acute Rehab PT Goals Patient Stated Goal: Resume previous lifestyle with decreased pain PT Goal Formulation: With patient Time For Goal Achievement: 08/22/14 Potential to Achieve Goals: Good Progress towards PT goals: Progressing toward goals    Frequency  7X/week    PT Plan Current plan remains appropriate    Co-evaluation             End of Session Equipment Utilized During Treatment: Gait belt Activity Tolerance: Patient tolerated treatment well Patient left: in bed;with call bell/phone within reach;with family/visitor present     Time: [id number] PT Time Calculation (min) (ACUTE ONLY): 40 min  Charges:  $Gait Training: 23-37 mins $Therapeutic Activity: 8-22 mins                    G Codes:      , 08/16/2014, 5:13 PM

## 2014-08-16 NOTE — Progress Notes (Signed)
Physical Therapy Treatment Patient Details Name: Dawn Henderson MRN: [id number] DOB: 07-15-1934 Today's Date: 08/16/2014    History of Present Illness R THR; hx of pacemaker    PT Comments    Marked improvement in activity tolerance and stability vs previous sessions.  Will review stairs in pm.  Follow Up Recommendations  Home health PT     Equipment Recommendations  None recommended by PT    Recommendations for Other Services OT consult     Precautions / Restrictions Precautions Precautions: Fall Restrictions Weight Bearing Restrictions: No Other Position/Activity Restrictions: WBAT    Mobility  Bed Mobility Overal bed mobility: Needs Assistance;+2 for physical assistance Bed Mobility: Supine to Sit     Supine to sit: Supervision     General bed mobility comments: used leg lifter, extra time, cues for technique  Transfers Overall transfer level: Needs assistance Equipment used: Rolling walker (2 wheeled) Transfers: Sit to/from Stand Sit to Stand: Supervision         General transfer comment: cues for UE placement  Ambulation/Gait Ambulation/Gait assistance: Min guard;Supervision Ambulation Distance (Feet): 123 Feet Assistive device: Rolling walker (2 wheeled) Gait Pattern/deviations: Step-to pattern;Step-through pattern;Shuffle;Wide base of support;Trunk flexed Gait velocity: decr   General Gait Details: cues for posture, sequence, position from RW and to decrease BOS   Stairs            Wheelchair Mobility    Modified Rankin (Stroke Patients Only)       Balance                                    Cognition Arousal/Alertness: Awake/alert Behavior During Therapy: WFL for tasks assessed/performed Overall Cognitive Status: Within Functional Limits for tasks assessed                      Exercises Total Joint Exercises Ankle Circles/Pumps: AROM;Both;15 reps;Supine Quad Sets: AROM;Both;10 reps;Supine Gluteal Sets:  AROM;Both;10 reps;Supine Heel Slides: AAROM;Right;Supine;20 reps Hip ABduction/ADduction: AAROM;Right;Supine;15 reps    General Comments        Pertinent Vitals/Pain Pain Assessment: 0-10 Pain Score: 5  Pain Location: R hip Pain Descriptors / Indicators: Aching;Burning Pain Intervention(s): Limited activity within patient's tolerance;Monitored during session;Premedicated before session;Ice applied    Home Living                      Prior Function            PT Goals (current goals can now be found in the care plan section) Acute Rehab PT Goals Patient Stated Goal: Resume previous lifestyle with decreased pain PT Goal Formulation: With patient Time For Goal Achievement: 08/22/14 Potential to Achieve Goals: Good Progress towards PT goals: Progressing toward goals    Frequency  7X/week    PT Plan Current plan remains appropriate    Co-evaluation             End of Session Equipment Utilized During Treatment: Gait belt Activity Tolerance: Patient tolerated treatment well Patient left: in chair;with call bell/phone within reach;with family/visitor present     Time: 1117-1140 PT Time Calculation (min) (ACUTE ONLY): 23 min  Charges:  $Gait Training: 8-22 mins $Therapeutic Exercise: 8-22 mins                    G Codes:      , 08/16/2014, 12:56 PM

## 2014-08-16 NOTE — Evaluation (Signed)
Occupational Therapy Evaluation Patient Details Name: Dawn Henderson MRN: [id number] DOB: 06/04/1934 Today's Date: 08/16/2014    History of Present Illness R THR; hx of pacemaker   Clinical Impression   This 80 year old female was admitted for the above surgery.  She will benefit from skilled OT to increase safety and independence with adls.  Pt can perform basic adls with supervision/set up and AE.  Nephew (4 years old) will be staying with her and niece is next door.  Recommend HHOT for IADLs as pt wants to be as independent as possible.    Follow Up Recommendations  Home health OT    Equipment Recommendations  None recommended by OT    Recommendations for Other Services       Precautions / Restrictions Precautions Precautions: Fall Restrictions Other Position/Activity Restrictions: WBAT      Mobility Bed Mobility         Supine to sit: Supervision Sit to supine: Supervision   General bed mobility comments: used leg lifter, extra time, cues for technique  Transfers   Equipment used: Rolling walker (2 wheeled) Transfers: Sit to/from [facility] Sit to Stand: Supervision Stand pivot transfers: Supervision       General transfer comment: cues for UE placement    Balance                                            ADL Overall ADL's : Needs assistance/impaired     Grooming: Set up;Sitting   Upper Body Bathing: Set up;Sitting   Lower Body Bathing: Supervison/ safety;Sit to/from stand;With adaptive equipment   Upper Body Dressing : Set up;Sitting   Lower Body Dressing: Supervision/safety;Sit to/from stand;With adaptive equipment   Toilet Transfer: Supervision/safety;Stand-pivot;BSC;RW   Toileting- Water quality scientist and Hygiene: Supervision/safety;Sit to/from stand         General ADL Comments: pt transferred to [facility] and completed LB adls with reacher and sock aide:  educated on AE kit vs items one by one. She  used leg lifter and would like to get one of these.  Nephew asleep in room; he can help wtih socks.  Pt did not want to walk to bathroom at this time. Reviewed shower transfer  Pt's niece lives next door.  She will plan to have her assist with shower transfer.  Pt has been using a dresser--would like to practice prior to d/c     Vision     Perception     Praxis      Pertinent Vitals/Pain Pain Score: 3  Pain Location: R hip Pain Descriptors / Indicators: Sore Pain Intervention(s): Limited activity within patient's tolerance;Monitored during session;Premedicated before session;Repositioned;Ice applied     Hand Dominance     Extremity/Trunk Assessment Upper Extremity Assessment Upper Extremity Assessment: Overall WFL for tasks assessed           Communication Communication Communication: No difficulties   Cognition Arousal/Alertness: Awake/alert Behavior During Therapy: WFL for tasks assessed/performed Overall Cognitive Status: Within Functional Limits for tasks assessed                     General Comments       Exercises       Shoulder Instructions      Home Living Family/patient expects to be discharged to:: Private residence Living Arrangements: Other relatives Available Help at Discharge: Available 24 hours/day Type  of Home: House             Bathroom Shower/Tub: Walk-in Psychologist, prison and probation services: Standard     Home Equipment: Environmental consultant - 2 wheels;Walker - 4 wheels;Bedside commode;Shower seat          Prior Functioning/Environment Level of Independence: Independent             OT Diagnosis: Generalized weakness   OT Problem List: Decreased strength;Decreased activity tolerance;Decreased knowledge of use of DME or AE;Pain   OT Treatment/Interventions: Self-care/ADL training;DME and/or AE instruction;Patient/family education    OT Goals(Current goals can be found in the care plan section) Acute Rehab OT Goals Patient Stated Goal:  Resume previous lifestyle with decreased pain OT Goal Formulation: With patient Time For Goal Achievement: 08/23/14 Potential to Achieve Goals: Good ADL Goals Pt Will Transfer to Toilet: with supervision;ambulating;bedside commode Pt Will Perform Tub/Shower Transfer: Shower transfer;with min guard assist;shower seat;rolling walker Additional ADL Goal #1: nephew will verbalize/demonstrate guarding when pt ambulates to bathroom  OT Frequency: Min 2X/week   Barriers to D/C:            Co-evaluation              End of Session    Activity Tolerance: Patient tolerated treatment well Patient left: in bed;with call bell/phone within reach;with family/visitor present   Time: 0729-0757 OT Time Calculation (min): 28 min Charges:  OT General Charges $OT Visit: 1 Procedure OT Evaluation $Initial OT Evaluation Tier I: 1 Procedure OT Treatments $Self Care/Home Management : 8-22 mins G-Codes:    , 08/18/2014, 8:11 AM   Lesle Chris, OTR/L [phone] Aug 18, 2014

## 2014-08-30 NOTE — Discharge Summary (Signed)
Physician Discharge Summary  Patient ID: Dawn Henderson MRN: [id number] DOB/AGE: 05/29/1934 80 y.o.  Admit date: 08/14/2014 Discharge date: 08/16/2014   Procedures:  Procedure(s) (LRB): RIGHT TOTAL HIP ARTHROPLASTY ANTERIOR APPROACH (Right)  Attending Physician:  Dr. Durene RomansMatthew Olin   Admission Diagnoses:   Right hip primary OA / pain  Discharge Diagnoses:  Principal Problem:   S/P right THA, AA Active Problems:   Overweight (BMI 25.0-29.9)  Past Medical History  Diagnosis Date  . Mass in neck Nov. 21, 2014    Right - enlarged and very tender  . Presence of permanent cardiac pacemaker     ST Jude. 08-07-14 signed cardiac orders with chart -Dr. Danella MaiersJack Painter.  . Palpitations   . Peripheral vascular disease   . Urinary incontinence     " leaks when stands up"   . History of hiatal hernia     small   . Arthritis   . SSS (sick sinus syndrome)   . SVT (supraventricular tachycardia)   . Mitral valve insufficiency     HPI:    Dawn Henderson, 80 y.o. female, has a history of pain and functional disability in the right hip(s) due to arthritis and patient has failed non-surgical conservative treatments for greater than 12 weeks to include NSAID's and/or analgesics, corticosteriod injections, use of assistive devices and activity modification. Onset of symptoms was gradual starting 2+ years ago with gradually worsening course since that time.The patient noted no past surgery on the right hip(s). Patient currently rates pain in the right hip at 9 out of 10 with activity. Patient has night pain, worsening of pain with activity and weight bearing, trendelenberg gait, pain that interfers with activities of daily living and pain with passive range of motion. Patient has evidence of periarticular osteophytes and joint space narrowing by imaging studies. This condition presents safety issues increasing the risk of falls. There is no current active infection. Risks, benefits and expectations were  discussed with the patient. Risks including but not limited to the risk of anesthesia, blood clots, nerve damage, blood vessel damage, failure of the prosthesis, infection and up to and including death. Patient understand the risks, benefits and expectations and wishes to proceed with surgery.   PCP: Lorelei PontMAHONEY,MARK, DO   Discharged Condition: good  Hospital Course:  Patient underwent the above stated procedure on 08/14/2014. Patient tolerated the procedure well and brought to the recovery room in good condition and subsequently to the floor.  POD #1 BP: 100/75 ; Pulse: 135 ; Temp: 98.2 F (36.8 C) ; Resp: 14 Patient reports pain as mild, pain controlled. Other than increased HR, no events throughout the night. She does have occasional tachycardia episodes, which resolve over time. Dorsiflexion/plantar flexion intact, incision: dressing C/D/I, no cellulitis present and compartment soft.   LABS  Basename    HGB  12.2  HCT  36.1   POD #2  BP: 133/71 ; Pulse: 60 ; Temp: 98.3 F (36.8 C) ; Resp: 16 Patient reports pain as mild, pain controlled. No events throughout the night. Feels that she is doing well with PT. Ready to be discharged home. Dorsiflexion/plantar flexion intact, incision: dressing C/D/I, no cellulitis present and compartment soft.   LABS  Basename    HGB  12.6  HCT  37.4    Discharge Exam: General appearance: alert, cooperative and no distress Extremities: Homans sign is negative, no sign of DVT, no edema, redness or tenderness in the calves or thighs and no ulcers, gangrene or trophic changes  Disposition: Home with follow up in 2 weeks   Follow-up Information    Follow up with Shelda Pal, MD. Schedule an appointment as soon as possible for a visit in 2 weeks.   Specialty:  Orthopedic Surgery   Contact information:   [address] [place] [address] [phone]       Follow up with [facility].   Why:  Home health physical therapy    Contact information:   [phone]      Discharge Instructions    Call MD / Call 911    Complete by:  As directed   If you experience chest pain or shortness of breath, CALL 911 and be transported to the hospital emergency room.  If you develope a fever above 101 F, pus (white drainage) or increased drainage or redness at the wound, or calf pain, call your surgeon's office.     Change dressing    Complete by:  As directed   Maintain surgical dressing until follow up in the clinic. If the edges start to pull up, may reinforce with tape. If the dressing is no longer working, may remove and cover with gauze and tape, but must keep the area dry and clean.  Call with any questions or concerns.     Constipation Prevention    Complete by:  As directed   Drink plenty of fluids.  Prune juice may be helpful.  You may use a stool softener, such as Colace (over the counter) 100 mg twice a day.  Use MiraLax (over the counter) for constipation as needed.     Diet - low sodium heart healthy    Complete by:  As directed      Discharge instructions    Complete by:  As directed   Maintain surgical dressing until follow up in the clinic. If the edges start to pull up, may reinforce with tape. If the dressing is no longer working, may remove and cover with gauze and tape, but must keep the area dry and clean.  Follow up in 2 weeks at [facility]. Call with any questions or concerns.     Increase activity slowly as tolerated    Complete by:  As directed      TED hose    Complete by:  As directed   Use stockings (TED hose) for 2 weeks on both leg(s).  You may remove them at night for sleeping.     Weight bearing as tolerated    Complete by:  As directed   Laterality:  right  Extremity:  Lower             Medication List    STOP taking these medications        aspirin 81 MG tablet  Replaced by:  aspirin EC 325 MG tablet     HYDROcodone-acetaminophen 5-325 MG per tablet  Commonly known  as:  NORCO/VICODIN  Replaced by:  HYDROcodone-acetaminophen 7.5-325 MG per tablet     polyethylene glycol powder powder  Commonly known as:  GLYCOLAX/MIRALAX  Replaced by:  polyethylene glycol packet      TAKE these medications        aspirin EC 325 MG tablet  Take 1 tablet (325 mg total) by mouth 2 (two) times daily. Take for 4 weeks.     docusate sodium 100 MG capsule  Commonly known as:  COLACE  Take 1 capsule (100 mg total) by mouth 2 (two) times daily.     ferrous  sulfate 325 (65 FE) MG tablet  Take 1 tablet (325 mg total) by mouth 3 (three) times daily after meals.     HYDROcodone-acetaminophen 7.5-325 MG per tablet  Commonly known as:  NORCO  Take 1-2 tablets by mouth every 4 (four) hours as needed for moderate pain.     loratadine 10 MG tablet  Commonly known as:  CLARITIN  Take 10 mg by mouth daily as needed for allergies.     metoprolol 50 MG tablet  Commonly known as:  LOPRESSOR  Take 50 mg by mouth daily.     omeprazole 20 MG capsule  Commonly known as:  PRILOSEC  Take 20 mg by mouth daily.     polyethylene glycol packet  Commonly known as:  MIRALAX / GLYCOLAX  Take 17 g by mouth 2 (two) times daily.     rivaroxaban 10 MG Tabs tablet  Commonly known as:  XARELTO  Take 1 tablet (10 mg total) by mouth daily.     tiZANidine 4 MG tablet  Commonly known as:  ZANAFLEX  Take 1 tablet (4 mg total) by mouth every 6 (six) hours as needed for muscle spasms.         Signed: Anastasio Auerbach.    PA-C  08/30/2014, 11:19 AM

## 2016-07-03 IMAGING — DX DG HIP (WITH OR WITHOUT PELVIS) 1V PORT*R*
2 series · 2 of 2 positions shown · non-contrast
Comparison: None.

CLINICAL DATA: Status post right hip replacement

EXAM:
DG C-ARM 1-60 MIN - NRPT [facility]; RIGHT HIP (WITH PELVIS) 1 VIEW
PORTABLE

[hip x-table]
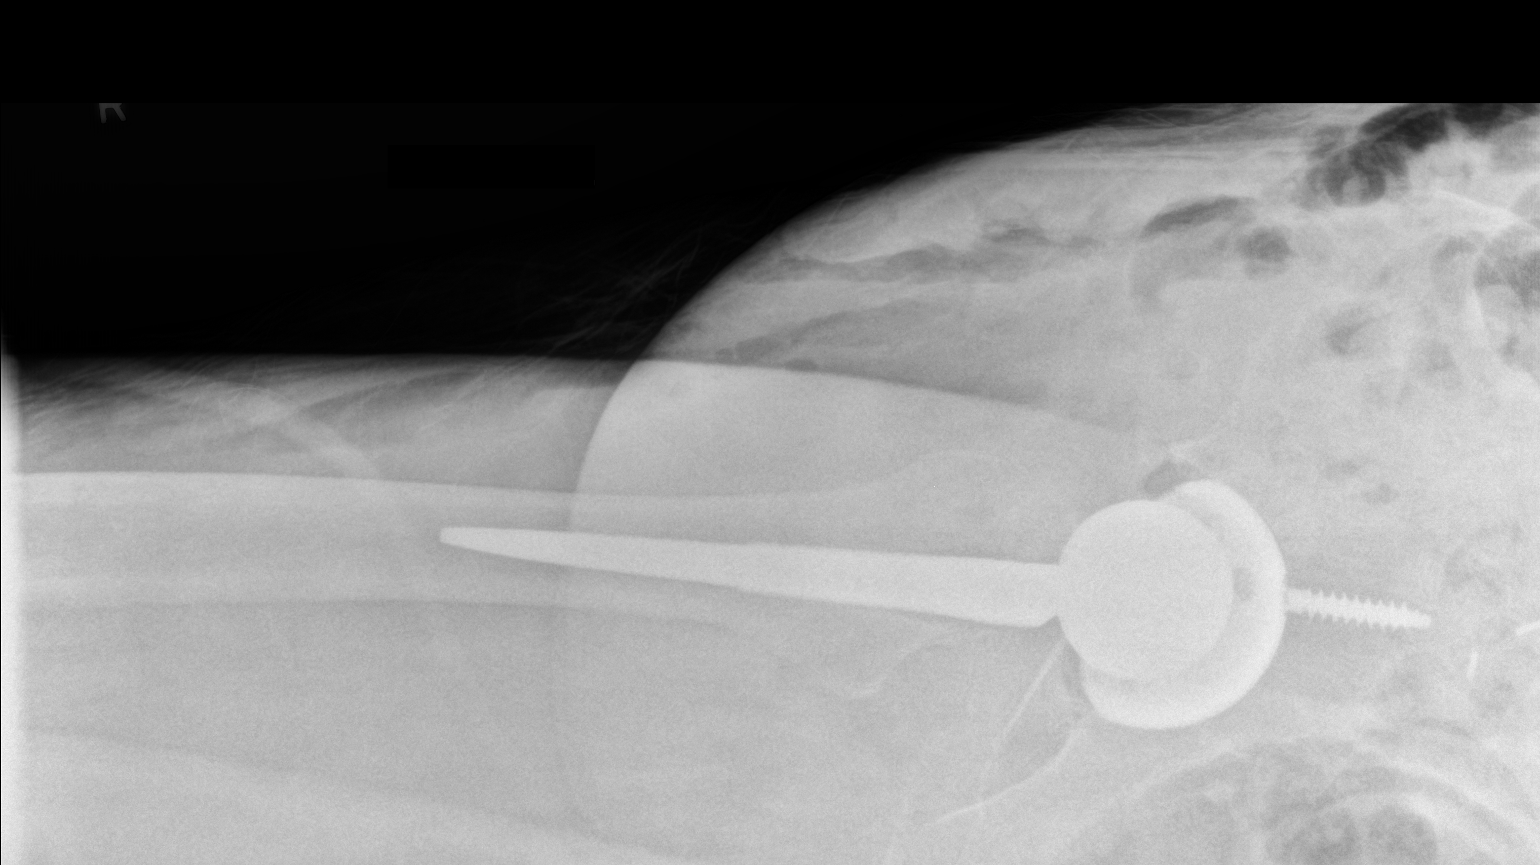

[pelvis ap]
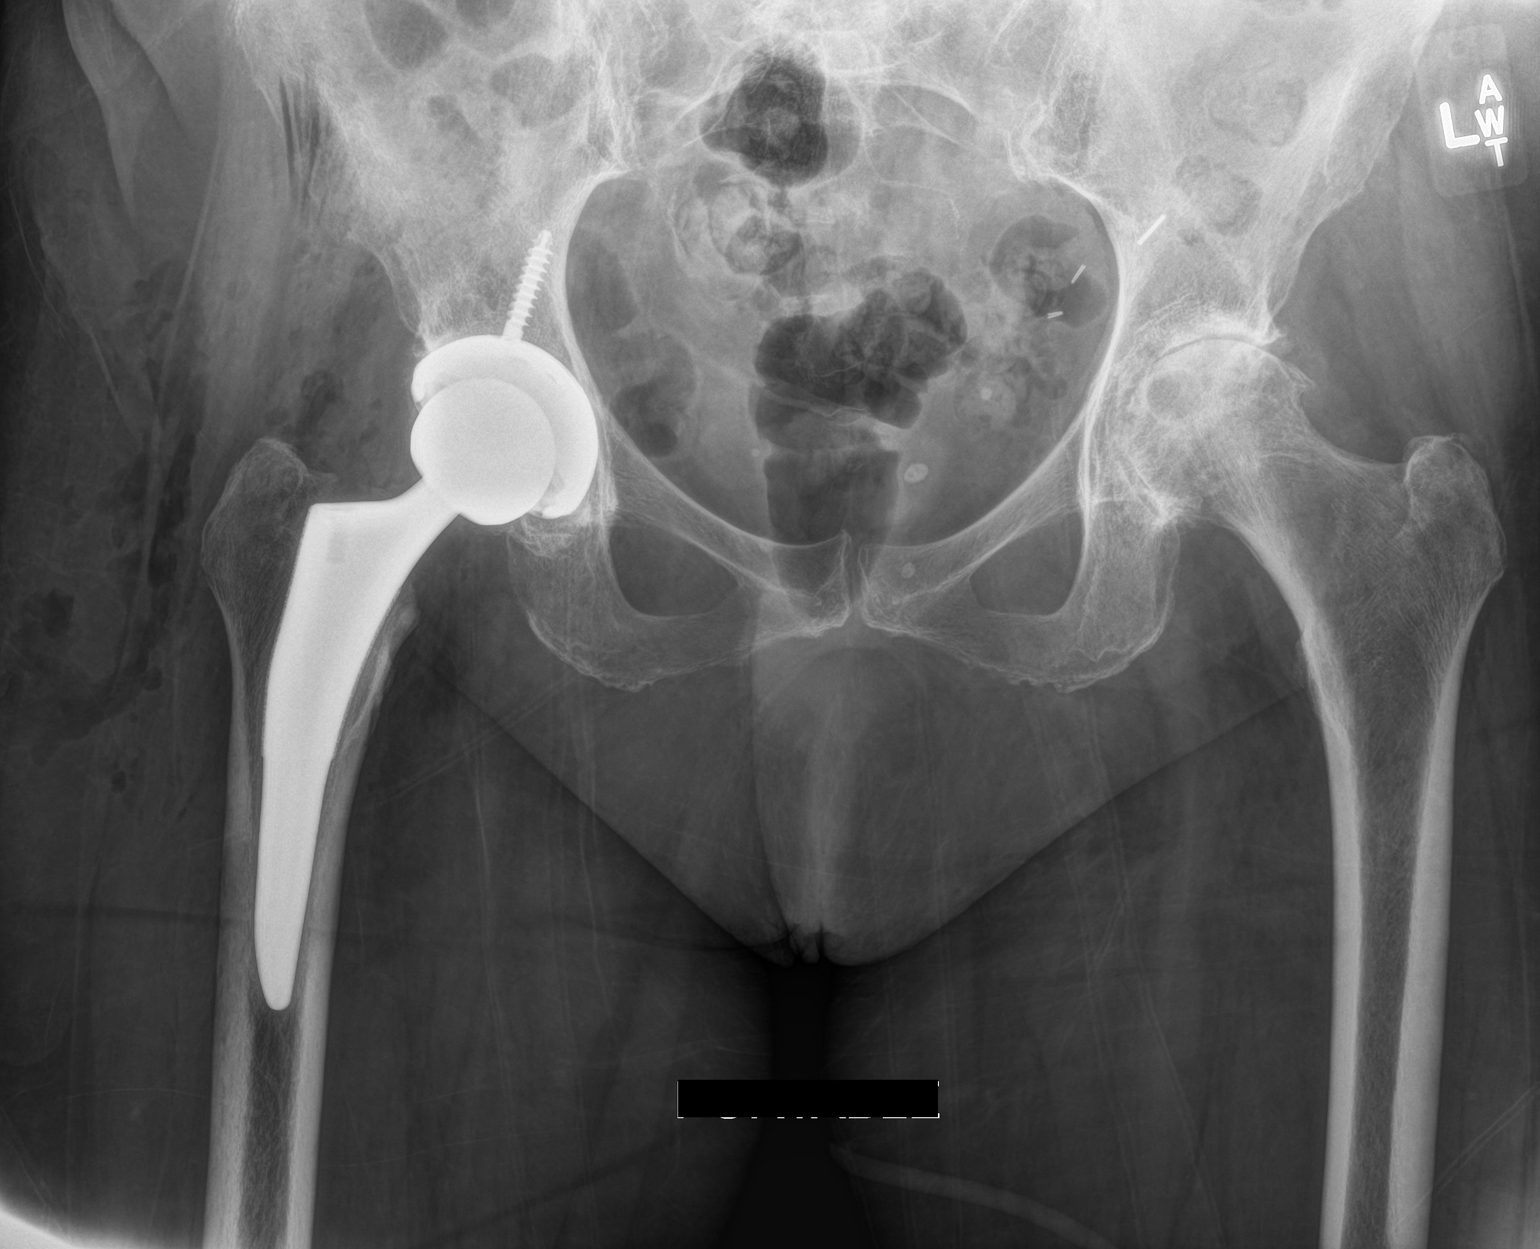

[2 of 2 positions shown; findings below may reference images not displayed]

FINDINGS: Right hip prosthesis is noted. No acute bony abnormality is seen.
Severe degenerative changes of the left hip joint are noted.
IMPRESSION: Status post right hip replacement without acute abnormality.

## 2017-08-16 ENCOUNTER — Ambulatory Visit (INDEPENDENT_AMBULATORY_CARE_PROVIDER_SITE_OTHER): Payer: Medicare Other | Admitting: Otolaryngology

## 2017-08-16 DIAGNOSIS — J343 Hypertrophy of nasal turbinates: Secondary | ICD-10-CM

## 2017-08-16 DIAGNOSIS — J342 Deviated nasal septum: Secondary | ICD-10-CM

## 2017-08-16 DIAGNOSIS — H903 Sensorineural hearing loss, bilateral: Secondary | ICD-10-CM | POA: Diagnosis not present

## 2017-08-16 DIAGNOSIS — J310 Chronic rhinitis: Secondary | ICD-10-CM | POA: Diagnosis not present

## 2017-10-25 ENCOUNTER — Ambulatory Visit (INDEPENDENT_AMBULATORY_CARE_PROVIDER_SITE_OTHER): Payer: Medicare Other | Admitting: Otolaryngology

## 2017-11-15 ENCOUNTER — Ambulatory Visit (INDEPENDENT_AMBULATORY_CARE_PROVIDER_SITE_OTHER): Payer: Medicare Other | Admitting: Otolaryngology

## 2017-11-15 DIAGNOSIS — J343 Hypertrophy of nasal turbinates: Secondary | ICD-10-CM

## 2017-11-15 DIAGNOSIS — J342 Deviated nasal septum: Secondary | ICD-10-CM

## 2017-11-15 DIAGNOSIS — J310 Chronic rhinitis: Secondary | ICD-10-CM | POA: Diagnosis not present

## 2022-09-18 ENCOUNTER — Emergency Department (HOSPITAL_COMMUNITY): Payer: Medicare Other

## 2022-09-18 ENCOUNTER — Other Ambulatory Visit: Payer: Self-pay

## 2022-09-18 ENCOUNTER — Emergency Department (HOSPITAL_COMMUNITY)
Admission: EM | Admit: 2022-09-18 | Discharge: 2022-09-18 | Disposition: A | Discharge status: Home / Self Care | Payer: Medicare Other | Source: Home / Self Care | Attending: Emergency Medicine | Admitting: Emergency Medicine

## 2022-09-18 ENCOUNTER — Encounter (HOSPITAL_COMMUNITY): Payer: Self-pay

## 2022-09-18 DIAGNOSIS — N289 Disorder of kidney and ureter, unspecified: Secondary | ICD-10-CM | POA: Diagnosis not present

## 2022-09-18 DIAGNOSIS — Z7901 Long term (current) use of anticoagulants: Secondary | ICD-10-CM | POA: Diagnosis not present

## 2022-09-18 DIAGNOSIS — K5732 Diverticulitis of large intestine without perforation or abscess without bleeding: Secondary | ICD-10-CM | POA: Diagnosis not present

## 2022-09-18 DIAGNOSIS — R350 Frequency of micturition: Secondary | ICD-10-CM | POA: Diagnosis present

## 2022-09-18 DIAGNOSIS — K5792 Diverticulitis of intestine, part unspecified, without perforation or abscess without bleeding: Secondary | ICD-10-CM

## 2022-09-18 LAB — URINALYSIS, ROUTINE W REFLEX MICROSCOPIC
Bilirubin Urine: NEGATIVE
Glucose, UA: NEGATIVE mg/dL
Hgb urine dipstick: NEGATIVE
Ketones, ur: 5 mg/dL — AB
Leukocytes,Ua: NEGATIVE
Nitrite: NEGATIVE
Protein, ur: NEGATIVE mg/dL
Specific Gravity, Urine: 1.016 (ref 1.005–1.030)
pH: 6 (ref 5.0–8.0)

## 2022-09-18 LAB — CBC WITH DIFFERENTIAL/PLATELET
Abs Immature Granulocytes: 0.01 10*3/uL (ref 0.00–0.07)
Basophils Absolute: 0 10*3/uL (ref 0.0–0.1)
Basophils Relative: 0 %
Eosinophils Absolute: 0 10*3/uL (ref 0.0–0.5)
Eosinophils Relative: 0 %
HCT: 38.3 % (ref 36.0–46.0)
Hemoglobin: 11.9 g/dL — ABNORMAL LOW (ref 12.0–15.0)
Immature Granulocytes: 0 %
Lymphocytes Relative: 17 %
Lymphs Abs: 1.1 10*3/uL (ref 0.7–4.0)
MCH: 28.4 pg (ref 26.0–34.0)
MCHC: 31.1 g/dL (ref 30.0–36.0)
MCV: 91.4 fL (ref 80.0–100.0)
Monocytes Absolute: 0.8 10*3/uL (ref 0.1–1.0)
Monocytes Relative: 12 %
Neutro Abs: 4.7 10*3/uL (ref 1.7–7.7)
Neutrophils Relative %: 71 %
Platelets: 101 10*3/uL — ABNORMAL LOW (ref 150–400)
RBC: 4.19 MIL/uL (ref 3.87–5.11)
RDW: 14.2 % (ref 11.5–15.5)
WBC: 6.6 10*3/uL (ref 4.0–10.5)
nRBC: 0 % (ref 0.0–0.2)

## 2022-09-18 LAB — COMPREHENSIVE METABOLIC PANEL
ALT: 10 U/L (ref 0–44)
AST: 16 U/L (ref 15–41)
Albumin: 3.5 g/dL (ref 3.5–5.0)
Alkaline Phosphatase: 61 U/L (ref 38–126)
Anion gap: 8 (ref 5–15)
BUN: 18 mg/dL (ref 8–23)
CO2: 29 mmol/L (ref 22–32)
Calcium: 9.3 mg/dL (ref 8.9–10.3)
Chloride: 101 mmol/L (ref 98–111)
Creatinine, Ser: 1.04 mg/dL — ABNORMAL HIGH (ref 0.44–1.00)
GFR, Estimated: 52 mL/min — ABNORMAL LOW (ref >60)
Glucose, Bld: 118 mg/dL — ABNORMAL HIGH (ref 70–99)
Potassium: 4.3 mmol/L (ref 3.5–5.1)
Sodium: 138 mmol/L (ref 135–145)
Total Bilirubin: 0.9 mg/dL (ref 0.3–1.2)
Total Protein: 6.6 g/dL (ref 6.5–8.1)

## 2022-09-18 LAB — LIPASE, BLOOD: Lipase: 29 U/L (ref 11–51)

## 2022-09-18 MED ORDER — METRONIDAZOLE 500 MG PO TABS: 500.0000 mg | ORAL_TABLET | Freq: Two times a day (BID) | ORAL | 0 refills | Status: DC

## 2022-09-18 MED ORDER — CIPROFLOXACIN HCL 500 MG PO TABS: 500.0000 mg | ORAL_TABLET | Freq: Two times a day (BID) | ORAL | 0 refills | Status: DC

## 2022-09-18 MED ORDER — SODIUM CHLORIDE 0.9 % IV BOLUS
500.0000 mL | Freq: Once | INTRAVENOUS | Status: AC
  Administered 2022-09-18: 500 mL via INTRAVENOUS

## 2022-09-18 MED ORDER — IOHEXOL 300 MG/ML  SOLN
100.0000 mL | Freq: Once | INTRAMUSCULAR | Status: AC | PRN
  Administered 2022-09-18: 100 mL via INTRAVENOUS

## 2022-09-18 NOTE — ED Provider Notes (Signed)
[place] EMERGENCY DEPARTMENT AT [facility] Provider Note   CSN: [id number] Arrival date & time: 09/18/22  1116     History  Chief Complaint  Patient presents with   Urinary Frequency    Dawn Henderson is a 88 y.o. female.  She is here with a complaint of lower abdominal pain that is been going on for 2 days.  It is associated with nausea and a little bit of itching when she urinates.  She is generally incontinent of urine and uses briefs.  She does not think she has had a fever but she feels cold all the time.  Her bowels have been regular.  She has had some problems with upper abdominal pain and her doctor has her on a PPI.  Has known hiatal hernia.  The history is provided by the patient.  Abdominal Pain Pain location:  RLQ, LLQ and suprapubic Pain radiates to:  Does not radiate Pain severity:  Severe Onset quality:  Gradual Duration:  2 days Timing:  Constant Progression:  Unchanged Chronicity:  New Context: not trauma   Relieved by:  None tried Worsened by:  Nothing Ineffective treatments:  None tried Associated symptoms: chills, dysuria and nausea   Associated symptoms: no chest pain, no constipation, no cough, no diarrhea, no fever, no hematemesis and no vomiting        Home Medications Prior to Admission medications   Medication Sig Start Date End Date Taking? Authorizing Provider  docusate sodium (COLACE) 100 MG capsule Take 1 capsule (100 mg total) by mouth 2 (two) times daily. 08/15/14   Lanney Gins, PA-C  ferrous sulfate 325 (65 FE) MG tablet Take 1 tablet (325 mg total) by mouth 3 (three) times daily after meals. 08/15/14   Lanney Gins, PA-C  HYDROcodone-acetaminophen (NORCO) 7.5-325 MG per tablet Take 1-2 tablets by mouth every 4 (four) hours as needed for moderate pain. 08/15/14   Lanney Gins, PA-C  loratadine (CLARITIN) 10 MG tablet Take 10 mg by mouth daily as needed for allergies.     [provider]  metoprolol (LOPRESSOR) 50  MG tablet Take 50 mg by mouth daily.    [provider]  omeprazole (PRILOSEC) 20 MG capsule Take 20 mg by mouth daily.     [provider]  polyethylene glycol (MIRALAX / GLYCOLAX) packet Take 17 g by mouth 2 (two) times daily. 08/15/14   Lanney Gins, PA-C  rivaroxaban (XARELTO) 10 MG TABS tablet Take 1 tablet (10 mg total) by mouth daily. 08/15/14   Lanney Gins, PA-C  tiZANidine (ZANAFLEX) 4 MG tablet Take 1 tablet (4 mg total) by mouth every 6 (six) hours as needed for muscle spasms. 08/15/14   Lanney Gins, PA-C      Allergies    Penicillins    Review of Systems   Review of Systems  Constitutional:  Positive for chills. Negative for fever.  Respiratory:  Negative for cough.   Cardiovascular:  Negative for chest pain.  Gastrointestinal:  Positive for abdominal pain and nausea. Negative for constipation, diarrhea, hematemesis and vomiting.  Genitourinary:  Positive for dysuria.    Physical Exam Updated Vital Signs BP (!) 165/89   Pulse 82   Temp 98 F (36.7 C) (Oral)   Resp 18   Ht 5\' 8"  (1.727 m)   Wt 81.6 kg   SpO2 96%   BMI 27.37 kg/m  Physical Exam Vitals and nursing note reviewed.  Constitutional:      General: She is not in  acute distress.    Appearance: Normal appearance. She is well-developed.  HENT:     Head: Normocephalic and atraumatic.  Eyes:     Conjunctiva/sclera: Conjunctivae normal.  Cardiovascular:     Rate and Rhythm: Normal rate and regular rhythm.     Heart sounds: No murmur heard. Pulmonary:     Effort: Pulmonary effort is normal. No respiratory distress.     Breath sounds: Normal breath sounds.  Abdominal:     Palpations: Abdomen is soft.     Tenderness: There is no abdominal tenderness. There is no guarding or rebound.  Musculoskeletal:        General: No deformity. Normal range of motion.     Cervical back: Neck supple.  Skin:    General: Skin is warm and dry.     Capillary Refill: Capillary refill takes less  than 2 seconds.  Neurological:     General: No focal deficit present.     Mental Status: She is alert.     ED Results / Procedures / Treatments   Labs (all labs ordered are listed, but only abnormal results are displayed) Labs Reviewed  URINALYSIS, ROUTINE W REFLEX MICROSCOPIC - Abnormal; Notable for the following components:      Result Value   Ketones, ur 5 (*)    All other components within normal limits  COMPREHENSIVE METABOLIC PANEL - Abnormal; Notable for the following components:   Glucose, Bld 118 (*)    Creatinine, Ser 1.04 (*)    GFR, Estimated 52 (*)    All other components within normal limits  CBC WITH DIFFERENTIAL/PLATELET - Abnormal; Notable for the following components:   Hemoglobin 11.9 (*)    Platelets 101 (*)    All other components within normal limits  LIPASE, BLOOD    EKG None  Radiology CT ABDOMEN PELVIS W CONTRAST  Result Date: 09/18/2022 CLINICAL DATA:  Urinary frequency and burning. Lower abdominal pain. EXAM: CT ABDOMEN AND PELVIS WITH CONTRAST TECHNIQUE: Multidetector CT imaging of the abdomen and pelvis was performed using the standard protocol following bolus administration of intravenous contrast. RADIATION DOSE REDUCTION: This exam was performed according to the departmental dose-optimization program which includes automated exposure control, adjustment of the mA and/or kV according to patient size and/or use of iterative reconstruction technique. CONTRAST:  OMNIPAQUE IOHEXOL 300 MG/ML  SOLN COMPARISON:  None Available. FINDINGS: Lower chest: Slight linear opacity seen along bases likely scar or atelectasis. No pleural effusion. Pacemaker leads along the right side of the heart. Slight enlargement of the right atrium. No pericardial effusion. Hepatobiliary: No focal liver abnormality is seen. Status post cholecystectomy. No biliary dilatation. Patent portal vein. Pancreas: Mild atrophy of the pancreas.  No obvious pancreatic mass. Spleen: Normal  in size without focal abnormality. Adrenals/Urinary Tract: Adrenal glands are preserved. There is some atrophy of the left kidney. Several scattered Bosniak 1 benign cysts are seen. There also some parapelvic cysts. No follow-up of these lesions. There is a hyperdense area along the lower pole calyx on the left which is ectatic best seen on series 2 image 32 anteriorly. An underlying mass lesion is possible. Recommend further workup when appropriate. The ureters have normal course and caliber down to the bladder. Bladder is nondilated. Stomach/Bowel: Scattered colonic stool. The large bowel is nondilated. However there is significant wall thickening and stranding along the proximal sigmoid colon. There are several diverticula in the near this location in the findings would be most consistent with a diverticulitis. No complex features  of obstruction, free air or rim enhancing fluid collection. Scattered colonic stool elsewhere. Normal appendix in the right lower quadrant. The stomach is underdistended. Small bowel is nondilated. Vascular/Lymphatic: Aortic atherosclerosis. No enlarged abdominal or pelvic lymph nodes. Reproductive: Status post hysterectomy. No adnexal masses. Other: Small amount of simple free fluid in the pelvis. No free intra-air. Musculoskeletal: Scattered degenerative changes of the spine and pelvis. Multilevel disc bulging. Osteopenia. There is a significant joint effusion about the left hip with the advanced degenerative changes of the left hip. Right hip arthroplasty. IMPRESSION: Wall thickening with stranding along the sigmoid colon with diverticula consistent with an area of diverticulitis. No complex features at this time of obstruction, free air or abscess formation. Recommend follow up after treatment to confirm resolution and exclude secondary pathology. There is a small amount of free fluid in the pelvis. Dilatation with high density along the lower pole calyx on the left kidney. An  aggressive urothelial lesion is possible. Recommend dedicated workup when clinically appropriate. Electronically Signed   By: Karen Kays M.D.   On: 09/18/2022 15:09    Procedures Procedures    Medications Ordered in ED Medications  sodium chloride 0.9 % bolus 500 mL (has no administration in time range)    ED Course/ Medical Decision Making/ A&P Clinical Course as of 09/18/22 1730  Fri Sep 18, 2022  1523 Patient has acute uncomplicated diverticulitis.  She does also have signs of renal lesion that we will need follow-up.  We discussed inpatient treatment versus outpatient and she would rather be discharged and take oral antibiotics.  Recommended close follow-up with PCP. [MB]    Clinical Course User Index [MB] Terrilee Files, MD                                 Medical Decision Making Amount and/or Complexity of Data Reviewed Labs: ordered. Radiology: ordered.  Risk Prescription drug management.   This patient complains of lower abdominal pain x 2 days urinary symptoms; this involves an extensive number of treatment Options and is a complaint that carries with it a high risk of complications and morbidity. The differential includes UTI, pyelonephritis, diverticulitis, colitis, abscess appendicitis  I ordered, reviewed and interpreted labs, which included CBC with normal white count, hemoglobin slightly lower than priors, platelets low similar to priors, chemistries and LFTs fairly unremarkable, urinalysis infection I ordered medication IV fluids and reviewed PMP when indicated. I ordered imaging studies which included CT abdomen and pelvis and I independently    visualized and interpreted imaging which showed acute diverticulitis.  Also has a possible renal mass. Additional history obtained from patient's family member Previous records obtained and reviewed in epic no recent ED visits Social determinants considered, no significant barriers Critical Interventions:  None  After the interventions stated above, I reevaluated the patient and found patient to be well-appearing in no distress Admission and further testing considered, she was offered admission to the hospital for further management.  She felt she would be okay going home on oral antibiotics.  I discussed symptomatic treatment and the need for close follow-up with her PCP as she will likely require further imaging..  Return instructions discussed.         Final Clinical Impression(s) / ED Diagnoses Final diagnoses:  Acute diverticulitis  Renal lesion    Rx / DC Orders ED Discharge Orders          Ordered  ciprofloxacin (CIPRO) 500 MG tablet  2 times daily        09/18/22 1526    metroNIDAZOLE (FLAGYL) 500 MG tablet  2 times daily        09/18/22 1526              Terrilee Files, MD 09/18/22 1733

## 2022-09-18 NOTE — ED Triage Notes (Signed)
Lower ABD pain, urine frequency and burning on urination since yesterday morning.   Normal BM's

## 2022-09-18 NOTE — Discharge Instructions (Addendum)
You were seen in the emergency department for lower abdominal pain.  There was no sign of urinary tract infection but your CAT scan showed acute diverticulitis.  This is treated with antibiotics and bowel rest.  You also had signs of a kidney lesion that we will need follow-up.  Included below is a copy of the radiology report.  He can use Tylenol as needed for pain.  Drink plenty of fluids.  Return to the emergency department if any high fevers or worsening pain.  IMPRESSION:  Wall thickening with stranding along the sigmoid colon with  diverticula consistent with an area of diverticulitis. No complex  features at this time of obstruction, free air or abscess formation.  Recommend follow up after treatment to confirm resolution and  exclude secondary pathology. There is a small amount of free fluid  in the pelvis.    Dilatation with high density along the lower pole calyx on the left  kidney. An aggressive urothelial lesion is possible. Recommend  dedicated workup when clinically appropriate.

## 2022-09-18 NOTE — ED Notes (Signed)
Pt attempted to provide urine sample, was unable to void at this time.

## 2022-09-18 NOTE — ED Notes (Addendum)
Patient transported to CT 

## 2022-09-22 ENCOUNTER — Emergency Department (HOSPITAL_COMMUNITY)
Admission: EM | Admit: 2022-09-22 | Discharge: 2022-09-22 | Discharge status: Against Medical Advice | Payer: Medicare Other | Source: Home / Self Care

## 2022-09-22 ENCOUNTER — Other Ambulatory Visit: Payer: Self-pay

## 2022-09-22 ENCOUNTER — Encounter (HOSPITAL_COMMUNITY): Payer: Self-pay

## 2022-09-22 DIAGNOSIS — R110 Nausea: Secondary | ICD-10-CM | POA: Insufficient documentation

## 2022-09-22 DIAGNOSIS — R109 Unspecified abdominal pain: Secondary | ICD-10-CM | POA: Insufficient documentation

## 2022-09-22 DIAGNOSIS — Z5321 Procedure and treatment not carried out due to patient leaving prior to being seen by health care provider: Secondary | ICD-10-CM | POA: Insufficient documentation

## 2022-09-22 DIAGNOSIS — R0989 Other specified symptoms and signs involving the circulatory and respiratory systems: Secondary | ICD-10-CM | POA: Insufficient documentation

## 2022-09-22 DIAGNOSIS — N178 Other acute kidney failure: Secondary | ICD-10-CM | POA: Diagnosis not present

## 2022-09-22 LAB — COMPREHENSIVE METABOLIC PANEL
ALT: 11 U/L (ref 0–44)
AST: 19 U/L (ref 15–41)
Albumin: 3.4 g/dL — ABNORMAL LOW (ref 3.5–5.0)
Alkaline Phosphatase: 53 U/L (ref 38–126)
Anion gap: 10 (ref 5–15)
BUN: 28 mg/dL — ABNORMAL HIGH (ref 8–23)
CO2: 26 mmol/L (ref 22–32)
Calcium: 8.9 mg/dL (ref 8.9–10.3)
Chloride: 99 mmol/L (ref 98–111)
Creatinine, Ser: 3.5 mg/dL — ABNORMAL HIGH (ref 0.44–1.00)
GFR, Estimated: 12 mL/min — ABNORMAL LOW (ref >60)
Glucose, Bld: 152 mg/dL — ABNORMAL HIGH (ref 70–99)
Potassium: 3.8 mmol/L (ref 3.5–5.1)
Sodium: 135 mmol/L (ref 135–145)
Total Bilirubin: 0.7 mg/dL (ref 0.3–1.2)
Total Protein: 6.6 g/dL (ref 6.5–8.1)

## 2022-09-22 LAB — CBC
HCT: 37.2 % (ref 36.0–46.0)
Hemoglobin: 12 g/dL (ref 12.0–15.0)
MCH: 29.3 pg (ref 26.0–34.0)
MCHC: 32.3 g/dL (ref 30.0–36.0)
MCV: 90.7 fL (ref 80.0–100.0)
Platelets: 159 10*3/uL (ref 150–400)
RBC: 4.1 MIL/uL (ref 3.87–5.11)
RDW: 14.6 % (ref 11.5–15.5)
WBC: 5.8 10*3/uL (ref 4.0–10.5)
nRBC: 0 % (ref 0.0–0.2)

## 2022-09-22 LAB — LIPASE, BLOOD: Lipase: 30 U/L (ref 11–51)

## 2022-09-22 NOTE — ED Triage Notes (Signed)
Pt c/o abdominal pain. Was just seen here and diagnosed with diverticulitis. Pt also endorses runny nose and nausea.

## 2022-09-23 ENCOUNTER — Encounter (HOSPITAL_COMMUNITY): Payer: Self-pay

## 2022-09-23 ENCOUNTER — Inpatient Hospital Stay (HOSPITAL_COMMUNITY)
Admission: RE | Admit: 2022-09-23 | Discharge: 2022-10-07 | DRG: 683 | Disposition: A | Discharge status: Home / Self Care | Payer: Medicare Other | Source: Other Acute Inpatient Hospital | Attending: Internal Medicine | Admitting: Internal Medicine

## 2022-09-23 ENCOUNTER — Inpatient Hospital Stay (HOSPITAL_COMMUNITY): Payer: Medicare Other

## 2022-09-23 ENCOUNTER — Encounter (HOSPITAL_COMMUNITY): Payer: Self-pay | Admitting: Internal Medicine

## 2022-09-23 DIAGNOSIS — I739 Peripheral vascular disease, unspecified: Secondary | ICD-10-CM | POA: Diagnosis present

## 2022-09-23 DIAGNOSIS — Z66 Do not resuscitate: Secondary | ICD-10-CM | POA: Diagnosis present

## 2022-09-23 DIAGNOSIS — R432 Parageusia: Secondary | ICD-10-CM | POA: Diagnosis present

## 2022-09-23 DIAGNOSIS — Z9049 Acquired absence of other specified parts of digestive tract: Secondary | ICD-10-CM | POA: Diagnosis not present

## 2022-09-23 DIAGNOSIS — N281 Cyst of kidney, acquired: Secondary | ICD-10-CM | POA: Diagnosis present

## 2022-09-23 DIAGNOSIS — I959 Hypotension, unspecified: Secondary | ICD-10-CM | POA: Diagnosis present

## 2022-09-23 DIAGNOSIS — Z6831 Body mass index (BMI) 31.0-31.9, adult: Secondary | ICD-10-CM

## 2022-09-23 DIAGNOSIS — Z950 Presence of cardiac pacemaker: Secondary | ICD-10-CM

## 2022-09-23 DIAGNOSIS — D649 Anemia, unspecified: Secondary | ICD-10-CM | POA: Diagnosis present

## 2022-09-23 DIAGNOSIS — Z91041 Radiographic dye allergy status: Secondary | ICD-10-CM | POA: Diagnosis not present

## 2022-09-23 DIAGNOSIS — D696 Thrombocytopenia, unspecified: Secondary | ICD-10-CM | POA: Diagnosis present

## 2022-09-23 DIAGNOSIS — Z79899 Other long term (current) drug therapy: Secondary | ICD-10-CM

## 2022-09-23 DIAGNOSIS — I495 Sick sinus syndrome: Secondary | ICD-10-CM | POA: Diagnosis present

## 2022-09-23 DIAGNOSIS — E860 Dehydration: Secondary | ICD-10-CM | POA: Diagnosis present

## 2022-09-23 DIAGNOSIS — K5732 Diverticulitis of large intestine without perforation or abscess without bleeding: Secondary | ICD-10-CM | POA: Diagnosis present

## 2022-09-23 DIAGNOSIS — Z882 Allergy status to sulfonamides status: Secondary | ICD-10-CM

## 2022-09-23 DIAGNOSIS — R54 Age-related physical debility: Secondary | ICD-10-CM | POA: Diagnosis present

## 2022-09-23 DIAGNOSIS — R109 Unspecified abdominal pain: Secondary | ICD-10-CM | POA: Diagnosis present

## 2022-09-23 DIAGNOSIS — X58XXXA Exposure to other specified factors, initial encounter: Secondary | ICD-10-CM | POA: Diagnosis present

## 2022-09-23 DIAGNOSIS — E222 Syndrome of inappropriate secretion of antidiuretic hormone: Secondary | ICD-10-CM | POA: Diagnosis present

## 2022-09-23 DIAGNOSIS — K449 Diaphragmatic hernia without obstruction or gangrene: Secondary | ICD-10-CM | POA: Diagnosis present

## 2022-09-23 DIAGNOSIS — Z880 Allergy status to penicillin: Secondary | ICD-10-CM

## 2022-09-23 DIAGNOSIS — N178 Other acute kidney failure: Principal | ICD-10-CM | POA: Diagnosis present

## 2022-09-23 DIAGNOSIS — Z90710 Acquired absence of both cervix and uterus: Secondary | ICD-10-CM | POA: Diagnosis not present

## 2022-09-23 DIAGNOSIS — E669 Obesity, unspecified: Secondary | ICD-10-CM | POA: Diagnosis present

## 2022-09-23 DIAGNOSIS — I10 Essential (primary) hypertension: Secondary | ICD-10-CM | POA: Diagnosis present

## 2022-09-23 DIAGNOSIS — R110 Nausea: Secondary | ICD-10-CM | POA: Diagnosis present

## 2022-09-23 DIAGNOSIS — T508X5A Adverse effect of diagnostic agents, initial encounter: Secondary | ICD-10-CM | POA: Diagnosis present

## 2022-09-23 DIAGNOSIS — I878 Other specified disorders of veins: Secondary | ICD-10-CM | POA: Diagnosis present

## 2022-09-23 DIAGNOSIS — R32 Unspecified urinary incontinence: Secondary | ICD-10-CM | POA: Diagnosis present

## 2022-09-23 DIAGNOSIS — Z96641 Presence of right artificial hip joint: Secondary | ICD-10-CM | POA: Diagnosis present

## 2022-09-23 DIAGNOSIS — Z8616 Personal history of COVID-19: Secondary | ICD-10-CM

## 2022-09-23 DIAGNOSIS — N1411 Contrast-induced nephropathy: Secondary | ICD-10-CM | POA: Diagnosis present

## 2022-09-23 DIAGNOSIS — N179 Acute kidney failure, unspecified: Principal | ICD-10-CM | POA: Diagnosis present

## 2022-09-23 DIAGNOSIS — Z7901 Long term (current) use of anticoagulants: Secondary | ICD-10-CM

## 2022-09-23 DIAGNOSIS — M7989 Other specified soft tissue disorders: Secondary | ICD-10-CM | POA: Diagnosis present

## 2022-09-23 LAB — CBC
HCT: 35.7 % — ABNORMAL LOW (ref 36.0–46.0)
Hemoglobin: 11.6 g/dL — ABNORMAL LOW (ref 12.0–15.0)
MCH: 29.4 pg (ref 26.0–34.0)
MCHC: 32.5 g/dL (ref 30.0–36.0)
MCV: 90.6 fL (ref 80.0–100.0)
Platelets: 141 10*3/uL — ABNORMAL LOW (ref 150–400)
RBC: 3.94 MIL/uL (ref 3.87–5.11)
RDW: 14.7 % (ref 11.5–15.5)
WBC: 5 10*3/uL (ref 4.0–10.5)
nRBC: 0 % (ref 0.0–0.2)

## 2022-09-23 LAB — CREATININE, SERUM
Creatinine, Ser: 4.2 mg/dL — ABNORMAL HIGH (ref 0.44–1.00)
GFR, Estimated: 10 mL/min — ABNORMAL LOW (ref >60)

## 2022-09-23 MED ORDER — TRAZODONE HCL 50 MG PO TABS
50.0000 mg | ORAL_TABLET | Freq: Every evening | ORAL | Status: DC | PRN
  Administered 2022-09-28 – 2022-09-30 (×3): 50 mg via ORAL
  Filled 2022-09-23 (×6): qty 1

## 2022-09-23 MED ORDER — ACETAMINOPHEN 650 MG RE SUPP: 650.0000 mg | Freq: Four times a day (QID) | RECTAL | Status: DC | PRN

## 2022-09-23 MED ORDER — HYDRALAZINE HCL 20 MG/ML IJ SOLN: 10.0000 mg | INTRAMUSCULAR | Status: DC | PRN

## 2022-09-23 MED ORDER — SODIUM CHLORIDE 0.9 % IV SOLN: 1.0000 g | INTRAVENOUS | Status: DC

## 2022-09-23 MED ORDER — ONDANSETRON HCL 4 MG/2ML IJ SOLN
4.0000 mg | Freq: Four times a day (QID) | INTRAMUSCULAR | Status: DC | PRN
  Administered 2022-09-23 – 2022-10-02 (×4): 4 mg via INTRAVENOUS
  Filled 2022-09-23 (×4): qty 2

## 2022-09-23 MED ORDER — IOHEXOL 9 MG/ML PO SOLN
500.0000 mL | ORAL | Status: AC
  Administered 2022-09-23 (×2): 500 mL via ORAL

## 2022-09-23 MED ORDER — GUAIFENESIN 100 MG/5ML PO LIQD: 5.0000 mL | ORAL | Status: DC | PRN

## 2022-09-23 MED ORDER — SODIUM CHLORIDE 0.9 % IV SOLN
2.0000 g | INTRAVENOUS | Status: AC
  Administered 2022-09-23 – 2022-10-02 (×10): 2 g via INTRAVENOUS
  Filled 2022-09-23 (×10): qty 20

## 2022-09-23 MED ORDER — ALUM & MAG HYDROXIDE-SIMETH 200-200-20 MG/5ML PO SUSP
30.0000 mL | ORAL | Status: DC | PRN
  Administered 2022-09-23: 30 mL via ORAL
  Filled 2022-09-23: qty 30

## 2022-09-23 MED ORDER — IPRATROPIUM-ALBUTEROL 0.5-2.5 (3) MG/3ML IN SOLN: 3.0000 mL | RESPIRATORY_TRACT | Status: DC | PRN

## 2022-09-23 MED ORDER — SODIUM CHLORIDE 0.9 % IV SOLN: INTRAVENOUS | Status: AC

## 2022-09-23 MED ORDER — PANTOPRAZOLE SODIUM 40 MG PO TBEC
40.0000 mg | DELAYED_RELEASE_TABLET | Freq: Every day | ORAL | Status: DC
  Administered 2022-09-23 – 2022-10-04 (×12): 40 mg via ORAL
  Filled 2022-09-23 (×12): qty 1

## 2022-09-23 MED ORDER — ACETAMINOPHEN 325 MG PO TABS
650.0000 mg | ORAL_TABLET | Freq: Four times a day (QID) | ORAL | Status: DC | PRN
  Filled 2022-09-23 (×3): qty 2

## 2022-09-23 MED ORDER — SENNOSIDES-DOCUSATE SODIUM 8.6-50 MG PO TABS
1.0000 | ORAL_TABLET | Freq: Every evening | ORAL | Status: DC | PRN
  Administered 2022-09-25 – 2022-09-30 (×2): 1 via ORAL
  Filled 2022-09-23 (×2): qty 1

## 2022-09-23 MED ORDER — METOPROLOL TARTRATE 5 MG/5ML IV SOLN
5.0000 mg | INTRAVENOUS | Status: DC | PRN
  Administered 2022-09-29 – 2022-10-04 (×3): 5 mg via INTRAVENOUS
  Filled 2022-09-23 (×3): qty 5

## 2022-09-23 MED ORDER — HEPARIN SODIUM (PORCINE) 5000 UNIT/ML IJ SOLN
5000.0000 [IU] | Freq: Three times a day (TID) | INTRAMUSCULAR | Status: DC
  Administered 2022-09-23 – 2022-10-07 (×42): 5000 [IU] via SUBCUTANEOUS
  Filled 2022-09-23 (×42): qty 1

## 2022-09-23 MED ORDER — ONDANSETRON HCL 4 MG PO TABS
4.0000 mg | ORAL_TABLET | Freq: Four times a day (QID) | ORAL | Status: DC | PRN
  Filled 2022-09-23: qty 1

## 2022-09-23 MED ORDER — METRONIDAZOLE 500 MG/100ML IV SOLN
500.0000 mg | Freq: Two times a day (BID) | INTRAVENOUS | Status: AC
  Administered 2022-09-23 – 2022-10-03 (×20): 500 mg via INTRAVENOUS
  Filled 2022-09-23 (×20): qty 100

## 2022-09-23 NOTE — Plan of Care (Signed)

## 2022-09-23 NOTE — H&P (Addendum)
History and Physical    Byhalia Simmonds BJY:[id number] DOB: 04-27-1934 DOA: 09/23/2022  PCP: Lorelei Pont, DO Patient coming from: Transfer from [facility]  Chief Complaint: Abdominal pain and AKI  HPI: Dawn Henderson is a 89 y.o. female with medical history significant of tachybradycardia syndrome status post pacemaker, osteoarthritis, HTN comes to the hospital from [facility] for AKI.  Patient initially came to [facility], ER with abdominal pain and was diagnosed with acute uncomplicated sigmoid diverticulitis and was sent home on oral antibiotics.  After going home patient's symptoms persisted along with fever therefore went to [facility].  Workup showed acute kidney injury with creatinine of 3.5, baseline 1.0.  She was transferred to [facility] for further management When I saw the patient she did not have any complaints besides mild abdominal pain.  Denied any nausea, vomiting, diarrhea.  States her last colonoscopy was several years ago.   Review of Systems: As per HPI otherwise 10 point review of systems negative.  Review of Systems Otherwise negative except as per HPI, including: General: Denies fever, chills, night sweats or unintended weight loss. Resp: Denies cough, wheezing, shortness of breath. Cardiac: Denies chest pain, palpitations, orthopnea, paroxysmal nocturnal dyspnea. GI: Denies nausea, vomiting, diarrhea or constipation GU: Denies dysuria, frequency, hesitancy or incontinence MS: Denies muscle aches, joint pain or swelling Neuro: Denies headache, neurologic deficits (focal weakness, numbness, tingling), abnormal gait Psych: Denies anxiety, depression, SI/HI/AVH Skin: Denies new rashes or lesions ID: Denies sick contacts, exotic exposures, travel  Past Medical History:  Diagnosis Date   Arthritis    History of hiatal hernia    small    Mass in neck Nov. 21, 2014   Right - enlarged and very tender   Mitral valve insufficiency     Palpitations    Peripheral vascular disease (HCC)    Presence of permanent cardiac pacemaker    [employer]. 08-07-14 signed cardiac orders with chart -Dr. Danella Maiers.   SSS (sick sinus syndrome) (HCC)    SVT (supraventricular tachycardia)    Urinary incontinence    " leaks when stands up"     Past Surgical History:  Procedure Laterality Date   ABDOMINAL HYSTERECTOMY     CHOLECYSTECTOMY     INSERT / REPLACE / REMOVE PACEMAKER     TOTAL HIP ARTHROPLASTY Right 08/14/2014   Procedure: RIGHT TOTAL HIP ARTHROPLASTY ANTERIOR APPROACH;  Surgeon: Durene Romans, MD;  Location: [facility] ORS;  Service: Orthopedics;  Laterality: Right;    SOCIAL HISTORY:  reports that she has never smoked. She has never used smokeless tobacco. She reports that she does not drink alcohol and does not use drugs.  Allergies  Allergen Reactions   Penicillins Rash    Small rash in her 20's    FAMILY HISTORY: No family history on file.   Prior to Admission medications   Medication Sig Start Date End Date Taking? Authorizing Provider  ciprofloxacin (CIPRO) 500 MG tablet Take 1 tablet (500 mg total) by mouth 2 (two) times daily. 09/18/22   Terrilee Files, MD  docusate sodium (COLACE) 100 MG capsule Take 1 capsule (100 mg total) by mouth 2 (two) times daily. 08/15/14   Lanney Gins, PA-C  ferrous sulfate 325 (65 FE) MG tablet Take 1 tablet (325 mg total) by mouth 3 (three) times daily after meals. 08/15/14   Lanney Gins, PA-C  HYDROcodone-acetaminophen (NORCO) 7.5-325 MG per tablet Take 1-2 tablets by mouth every 4 (four) hours as needed for moderate pain.  08/15/14   Lanney Gins, PA-C  loratadine (CLARITIN) 10 MG tablet Take 10 mg by mouth daily as needed for allergies.     [provider]  metoprolol (LOPRESSOR) 50 MG tablet Take 50 mg by mouth daily.    [provider]  metroNIDAZOLE (FLAGYL) 500 MG tablet Take 1 tablet (500 mg total) by mouth 2 (two) times daily. 09/18/22   Terrilee Files, MD   omeprazole (PRILOSEC) 20 MG capsule Take 20 mg by mouth daily.     [provider]  polyethylene glycol (MIRALAX / GLYCOLAX) packet Take 17 g by mouth 2 (two) times daily. 08/15/14   Lanney Gins, PA-C  rivaroxaban (XARELTO) 10 MG TABS tablet Take 1 tablet (10 mg total) by mouth daily. 08/15/14   Lanney Gins, PA-C  tiZANidine (ZANAFLEX) 4 MG tablet Take 1 tablet (4 mg total) by mouth every 6 (six) hours as needed for muscle spasms. 08/15/14   Lanney Gins, PA-C    Physical Exam: Vitals:   09/23/22 0900  BP: (!) 128/96  Pulse: 95  Resp: 17  Temp: 98.7 F (37.1 C)  TempSrc: Oral  SpO2: 96%      Constitutional: NAD, calm, comfortable Eyes: PERRL, lids and conjunctivae normal ENMT: Mucous membranes are moist. Posterior pharynx clear of any exudate or lesions.Normal dentition.  Neck: normal, supple, no masses, no thyromegaly Respiratory: clear to auscultation bilaterally, no wheezing, no crackles. Normal respiratory effort. No accessory muscle use.  Cardiovascular: Regular rate and rhythm, no murmurs / rubs / gallops. No extremity edema. 2+ pedal pulses. No carotid bruits.  Abdomen: Abdomen is slightly tender to very deep palpation Musculoskeletal: no clubbing / cyanosis. No joint deformity upper and lower extremities. Good ROM, no contractures. Normal muscle tone.  Skin: no rashes, lesions, ulcers. No induration Neurologic: CN 2-12 grossly intact. Sensation intact, DTR normal. Strength 5/5 in all 4.  Psychiatric: Normal judgment and insight. Alert and oriented x 3. Normal mood.     Labs on Admission: I have personally reviewed following labs and imaging studies  CBC: Recent Labs  Lab 09/18/22 1233 09/22/22 2206  WBC 6.6 5.8  NEUTROABS 4.7  --   HGB 11.9* 12.0  HCT 38.3 37.2  MCV 91.4 90.7  PLT 101* 159   Basic Metabolic Panel: Recent Labs  Lab 09/18/22 1233 09/22/22 2206  NA 138 135  K 4.3 3.8  CL 101 99  CO2 29 26  GLUCOSE 118* 152*  BUN 18 28*   CREATININE 1.04* 3.50*  CALCIUM 9.3 8.9   GFR: Estimated Creatinine Clearance: 12.9 mL/min (A) (by C-G formula based on SCr of 3.5 mg/dL (H)). Liver Function Tests: Recent Labs  Lab 09/18/22 1233 09/22/22 2206  AST 16 19  ALT 10 11  ALKPHOS 61 53  BILITOT 0.9 0.7  PROT 6.6 6.6  ALBUMIN 3.5 3.4*   Recent Labs  Lab 09/18/22 1233 09/22/22 2206  LIPASE 29 30   No results for input(s): "AMMONIA" in the last 168 hours. Coagulation Profile: No results for input(s): "INR", "PROTIME" in the last 168 hours. Cardiac Enzymes: No results for input(s): "CKTOTAL", "CKMB", "CKMBINDEX", "TROPONINI" in the last 168 hours. BNP (last 3 results) No results for input(s): "PROBNP" in the last 8760 hours. HbA1C: No results for input(s): "HGBA1C" in the last 72 hours. CBG: No results for input(s): "GLUCAP" in the last 168 hours. Lipid Profile: No results for input(s): "CHOL", "HDL", "LDLCALC", "TRIG", "CHOLHDL", "LDLDIRECT" in the last 72 hours. Thyroid Function Tests: No results for  input(s): "TSH", "T4TOTAL", "FREET4", "T3FREE", "THYROIDAB" in the last 72 hours. Anemia Panel: No results for input(s): "VITAMINB12", "FOLATE", "FERRITIN", "TIBC", "IRON", "RETICCTPCT" in the last 72 hours. Urine analysis:    Component Value Date/Time   COLORURINE YELLOW 09/18/2022 1254   APPEARANCEUR CLEAR 09/18/2022 1254   LABSPEC 1.016 09/18/2022 1254   PHURINE 6.0 09/18/2022 1254   GLUCOSEU NEGATIVE 09/18/2022 1254   HGBUR NEGATIVE 09/18/2022 1254   BILIRUBINUR NEGATIVE 09/18/2022 1254   KETONESUR 5 (A) 09/18/2022 1254   PROTEINUR NEGATIVE 09/18/2022 1254   UROBILINOGEN 0.2 08/10/2014 1425   NITRITE NEGATIVE 09/18/2022 1254   LEUKOCYTESUR NEGATIVE 09/18/2022 1254   Sepsis Labs: !!!!!!!!!!!!!!!!!!!!!!!!!!!!!!!!!!!!!!!!!!!! @LABRCNTIP (procalcitonin:4,lacticidven:4) )No results found for this or any previous visit (from the past 240 hour(s)).   Radiological Exams on Admission: No results  found.   All images have been reviewed by me personally.  EKG: Independently reviewed.   Assessment/Plan Principal Problem:   AKI (acute kidney injury) (HCC) Active Problems:   Sigmoid diverticulitis    Acute kidney injury - Baseline creatinine 1.0, admission creatinine 3.5.  This is secondary to prerenal/dehydration.  Will give IV fluids and monitor urine output and renal function.  If fails to improve we will pursue further workup including renal ultrasound.  Acute sigmoid diverticulitis, uncomplicated - Hold off on home p.o. medication.  Will start patient on IV Rocephin and Flagyl.  Clear liquid diet.  No stool studies ordered as patient declines any diarrhea. -Had a colonoscopy several years ago, she can rediscuss need for colonoscopy with her outpatient provider in the next 6-8 weeks  Addendum 4 PM Nurse notified patient had some worsening abdominal pain.  No recent imaging since last ER visit.  Ordered CT abdomen pelvis with only p.o. contrast  History of tachybradycardia syndrome - Pacemaker exchange.  Follow-up outpatient cardiology.  On Toprol, awaiting pharmacy to confirm.  Hiatal hernia - PPI  Awaiting pharmacy to confirm MedRec.  DVT prophylaxis: Subcu heparin Code Status: DNR confirmed by patient Family Communication: Niece at bedside Consults called: None Admission status: MedSurg admission  Status is: Inpatient Remains inpatient appropriate because: Secondary to AKI and poor oral intake   Time Spent: 65 minutes.  >50% of the time was devoted to discussing the patients care, assessment, plan and disposition with other care givers along with counseling the patient about the risks and benefits of treatment.     Joline Maxcy MD [facility]  If 7PM-7AM, please contact night-coverage   09/23/2022, 11:01 AM

## 2022-09-24 DIAGNOSIS — N179 Acute kidney failure, unspecified: Secondary | ICD-10-CM | POA: Diagnosis not present

## 2022-09-24 MED ORDER — METOPROLOL TARTRATE 12.5 MG HALF TABLET
12.5000 mg | ORAL_TABLET | Freq: Three times a day (TID) | ORAL | Status: DC
  Administered 2022-09-24 – 2022-09-29 (×15): 12.5 mg via ORAL
  Filled 2022-09-24 (×16): qty 1

## 2022-09-24 MED ORDER — HYDROMORPHONE HCL 1 MG/ML IJ SOLN
0.2500 mg | Freq: Once | INTRAMUSCULAR | Status: AC
  Administered 2022-09-24: 0.25 mg via INTRAVENOUS
  Filled 2022-09-24: qty 0.5

## 2022-09-24 MED ORDER — LIDOCAINE 5 % EX PTCH
1.0000 | MEDICATED_PATCH | CUTANEOUS | Status: AC
  Administered 2022-09-24: 1 via TRANSDERMAL
  Filled 2022-09-24: qty 1

## 2022-09-24 NOTE — TOC Initial Note (Addendum)
Transition of Care [facility]) - Initial/Assessment Note    Patient Details  Name: Dawn Henderson MRN: [id number] Date of Birth: 04-17-1934  [facility]) CM/SW Contact:    Epifanio Lesches, RN Phone Number: 09/24/2022, 3:46 PM  Clinical Narrative:                 Admitted with abd pain and AKI. From home with daughter and nephew. PTA independent with ADL's. DME: RW. Pt without transportation issues or RX med concerns.  TOC team following and will assist with  needs....  8/12 Pt with persistent elevated creatinine, 5.45. Nephrology consulted, ? contrast induced nephropathy.   Expected Discharge Plan: Home/Self Care Barriers to Discharge: Continued Medical Work up   Patient Goals and CMS Choice            Expected Discharge Plan and Services   Discharge Planning Services: CM Consult   Living arrangements for the past 2 months: Single Family Home                                      Prior Living Arrangements/Services Living arrangements for the past 2 months: Single Family Home Lives with:: Adult Children (daughter and nephew) Patient language and need for interpreter reviewed:: No Do you feel safe going back to the place where you live?: Yes      Need for Family Participation in Patient Care: Yes (Comment) Care giver support system in place?: Yes (comment) Current home services: DME (RW) Criminal Activity/Legal Involvement Pertinent to Current Situation/Hospitalization: No - Comment as needed  Activities of Daily Living Home Assistive Devices/Equipment: Cane (specify quad or straight), Walker (specify type), Wheelchair, Eyeglasses, Bedside commode/3-in-1 ADL Screening (condition at time of admission) Patient's cognitive ability adequate to safely complete daily activities?: Yes Is the patient deaf or have difficulty hearing?: Yes Does the patient have difficulty seeing, even when wearing glasses/contacts?: No Does the patient have difficulty  concentrating, remembering, or making decisions?: No Patient able to express need for assistance with ADLs?: Yes Does the patient have difficulty dressing or bathing?: No Independently performs ADLs?: Yes (appropriate for developmental age) Does the patient have difficulty walking or climbing stairs?: Yes Weakness of Legs: Both Weakness of Arms/Hands: Both  Permission Sought/Granted                  Emotional Assessment     Affect (typically observed): Accepting Orientation: : Oriented to Situation, Oriented to  Time, Oriented to Place, Oriented to Self Alcohol / Substance Use: Not Applicable Psych Involvement: No (comment)  Admission diagnosis:  AKI (acute kidney injury) (HCC) [N17.9] Patient Active Problem List   Diagnosis Date Noted   Sigmoid diverticulitis 09/23/2022   AKI (acute kidney injury) (HCC) 09/23/2022   Overweight (BMI 25.0-29.9) 08/15/2014   S/P right THA, AA 08/14/2014   PCP:  Lorelei Pont, DO Pharmacy:   [facility] [phone] - [place], [place] - [address] [place] [address] Phone: [phone] Fax: [phone]     Social Determinants of Health (SDOH) Social History: SDOH Screenings   Food Insecurity: No Food Insecurity (09/23/2022)  Housing: Low Risk  (09/23/2022)  Transportation Needs: No Transportation Needs (09/23/2022)  Utilities: Not At Risk (09/23/2022)  Tobacco Use: Low Risk  (09/23/2022)   SDOH Interventions:     Readmission Risk Interventions     No data to display

## 2022-09-24 NOTE — Plan of Care (Signed)

## 2022-09-24 NOTE — Progress Notes (Addendum)
PROGRESS NOTE    Dawn Henderson  WGN:[id number] DOB: [birth year] DOA: 09/23/2022 PCP: Lorelei Pont, DO   Brief Narrative:  Dawn Henderson is a 88 y.o. female with medical history significant of tachybradycardia syndrome status post pacemaker, osteoarthritis, HTN comes to the hospital from [facility] for AKI and ongoing diverticulitis with questionable failure of antibiotics/therapy (Cipro/flagyl) in the outpatient setting.  Assessment & Plan:   Principal Problem:   AKI (acute kidney injury) (HCC) Active Problems:   Sigmoid diverticulitis  Acute sigmoid diverticulitis, uncomplicated - continue ceftriaxone/metronidazole - Continue IVF until PO intake improves given below - slowly advance diet as tolerated - CT abd/pelvis confirms acute inflammation/diverticulitis without clear abscess or perforation   Acute kidney injury - Baseline 1.0 - Creatinine continues to rise - continue IVF until PO intake improves  History of tachybradycardia syndrome - Follow-up outpatient cardiology for pacemaker management. -Home metoprolol 50mg TID - start 12.5 TID and titrate to maintain heart rate (transiently elevated overnight)  Hiatal hernia - Continue PPI  DVT prophylaxis: heparin injection 5,000 Units Start: 09/23/22 1400 Code Status:   Code Status: DNR Family Communication: At bedside  Status is: Inpt  Dispo: The patient is from: Home              Anticipated d/c is to: Home              Anticipated d/c date is: 24-48h              Patient currently NOT medically stable for discharge  Consultants:  None  Procedures:  None  Antimicrobials:  Ceftriaxone/Metronidazole   Subjective: No acute issues/events overnight - abdominal pain improving but not resolved. Nausea improving - requesting diet advancement.  Objective: Vitals:   09/23/22 1658 09/23/22 1833 09/23/22 2013 09/24/22 0546  BP:  (!) 155/83 138/81 106/61  Pulse:  67 (!) 110   Resp:  18 17 15   Temp:  98.6 F (37 C)  97.8 F (36.6 C) 98.6 F (37 C)  TempSrc:  Oral Oral Oral  SpO2:  99% 96% 94%  Height: 5\' 8"  (1.727 m)       Intake/Output Summary (Last 24 hours) at 09/24/2022 0734 Last data filed at 09/24/2022 0003 Gross per 24 hour  Intake 815.74 ml  Output --  Net 815.74 ml   There were no vitals filed for this visit.  Examination:  General exam: Appears calm and comfortable  Respiratory system: Clear to auscultation. Respiratory effort normal. Cardiovascular system: S1 & S2 heard, RRR. No JVD, murmurs, rubs, gallops or clicks. No pedal edema. Gastrointestinal system: Abdomen is nondistended, soft and nontender. No organomegaly or masses felt. Normal bowel sounds heard. Central nervous system: Alert and oriented. No focal neurological deficits. Extremities: Symmetric 5 x 5 power. Skin: No rashes, lesions or ulcers Psychiatry: Judgement and insight appear normal. Mood & affect appropriate.   Data Reviewed: I have personally reviewed following labs and imaging studies  CBC: Recent Labs  Lab 09/18/22 1233 09/22/22 2206 09/23/22 1119 09/24/22 0124  WBC 6.6 5.8 5.0 4.1  NEUTROABS 4.7  --   --   --   HGB 11.9* 12.0 11.6* 10.8*  HCT 38.3 37.2 35.7* 33.9*  MCV 91.4 90.7 90.6 88.7  PLT 101* 159 141* 133*   Basic Metabolic Panel: Recent Labs  Lab 09/18/22 1233 09/22/22 2206 09/23/22 1119 09/24/22 0124  NA 138 135  --  132*  K 4.3 3.8  --  3.8  CL 101 99  --  100  CO2  29 26  --  23  GLUCOSE 118* 152*  --  126*  BUN 18 28*  --  26*  CREATININE 1.04* 3.50* 4.20* 4.70*  CALCIUM 9.3 8.9  --  7.7*  MG  --   --   --  1.2*   GFR: Estimated Creatinine Clearance: 9.6 mL/min (A) (by C-G formula based on SCr of 4.7 mg/dL (H)). Liver Function Tests: Recent Labs  Lab 09/18/22 1233 09/22/22 2206  AST 16 19  ALT 10 11  ALKPHOS 61 53  BILITOT 0.9 0.7  PROT 6.6 6.6  ALBUMIN 3.5 3.4*   Recent Labs  Lab 09/18/22 1233 09/22/22 2206  LIPASE 29 30    No results found for this or any  previous visit (from the past 240 hour(s)).       Radiology Studies: CT ABDOMEN PELVIS WO CONTRAST  Result Date: 09/23/2022 CLINICAL DATA:  Abdominal pain, acute, nonlocalized EXAM: CT ABDOMEN AND PELVIS WITHOUT CONTRAST TECHNIQUE: Multidetector CT imaging of the abdomen and pelvis was performed following the standard protocol without IV contrast. RADIATION DOSE REDUCTION: This exam was performed according to the departmental dose-optimization program which includes automated exposure control, adjustment of the mA and/or kV according to patient size and/or use of iterative reconstruction technique. COMPARISON:  09/18/2022 FINDINGS: Lower chest: No acute abnormality. Hepatobiliary: No focal liver abnormality is seen. Status post cholecystectomy. No biliary dilatation. Pancreas: Unremarkable Spleen: Unremarkable Adrenals/Urinary Tract: The adrenal glands are unremarkable. The kidneys are normal in size and position. There is progressive, mild bilateral perinephric stranding which may be inflammatory in nature and reflective of an underlying nephritis. No hydronephrosis. 2 mm nonobstructing calculus noted within the lower pole of the left kidney. No ureteral calculi allowing for obscuration of the distal right ureter by streak artifact. The visualized bladder is unremarkable. Stomach/Bowel: Moderate sigmoid diverticulosis. There is very mild peri-diverticular inflammatory stranding involving the proximal sigmoid colon best seen on axial image # 70/3 and coronal image # 91/6 in keeping with changes of very mild uncomplicated sigmoid diverticulitis. This appears improved since prior examination. The stomach, small bowel, and large bowel are unremarkable and there is no evidence of obstruction or perforation. No free intraperitoneal gas or fluid. No loculated intra-abdominal fluid collections. Appendix normal. Vascular/Lymphatic: Aortic atherosclerosis. No enlarged abdominal or pelvic lymph nodes. Reproductive:  Status post hysterectomy. No adnexal masses. Other: No abdominal wall hernia Musculoskeletal: Right total hip arthroplasty has been performed. Severe left hip degenerative arthritis. Osseous structures are diffusely osteopenic. No acute bone abnormality. No suspicious lytic or blastic bone lesion identified. IMPRESSION: 1. Moderate sigmoid diverticulosis with very mild peri-diverticular inflammatory stranding involving the proximal sigmoid colon in keeping with changes of very mild uncomplicated sigmoid diverticulitis. This appears improved since prior examination. 2. Progressive, mild bilateral perinephric stranding which may be inflammatory in nature and reflective of an underlying nephritis. Correlation with renal function tests and urinalysis may be helpful. 3. 2 mm nonobstructing calculus within the lower pole of the left kidney. 4. Aortic atherosclerosis. Aortic Atherosclerosis (ICD10-I70.0). Electronically Signed   By: Helyn Numbers M.D.   On: 09/23/2022 19:52        Scheduled Meds:  heparin  5,000 Units Subcutaneous Q8H   lidocaine  1 patch Transdermal Q24H   pantoprazole  40 mg Oral Daily   Continuous Infusions:  sodium chloride 100 mL/hr at 09/23/22 1214   cefTRIAXone (ROCEPHIN)  IV 2 g (09/23/22 1410)   metronidazole 500 mg (09/24/22 0003)     LOS: 1 day  Time spent:  Azucena Fallen, DO [facility]  If 7PM-7AM, please contact night-coverage [email or web address]  09/24/2022, 7:34 AM

## 2022-09-24 NOTE — Plan of Care (Signed)

## 2022-09-25 DIAGNOSIS — N179 Acute kidney failure, unspecified: Secondary | ICD-10-CM | POA: Diagnosis not present

## 2022-09-25 LAB — BASIC METABOLIC PANEL
Anion gap: 9 (ref 5–15)
BUN: 26 mg/dL — ABNORMAL HIGH (ref 8–23)
CO2: 20 mmol/L — ABNORMAL LOW (ref 22–32)
Calcium: 7.9 mg/dL — ABNORMAL LOW (ref 8.9–10.3)
Chloride: 103 mmol/L (ref 98–111)
Creatinine, Ser: 5.55 mg/dL — ABNORMAL HIGH (ref 0.44–1.00)
GFR, Estimated: 7 mL/min — ABNORMAL LOW (ref >60)
Glucose, Bld: 115 mg/dL — ABNORMAL HIGH (ref 70–99)
Potassium: 3.9 mmol/L (ref 3.5–5.1)
Sodium: 132 mmol/L — ABNORMAL LOW (ref 135–145)

## 2022-09-25 MED ORDER — TRAMADOL HCL 50 MG PO TABS
50.0000 mg | ORAL_TABLET | Freq: Once | ORAL | Status: AC
  Administered 2022-09-25: 50 mg via ORAL
  Filled 2022-09-25: qty 1

## 2022-09-25 MED ORDER — LACTATED RINGERS IV SOLN: INTRAVENOUS | Status: DC

## 2022-09-25 MED ORDER — MAGNESIUM SULFATE 2 GM/50ML IV SOLN
2.0000 g | Freq: Once | INTRAVENOUS | Status: AC
  Administered 2022-09-25: 2 g via INTRAVENOUS
  Filled 2022-09-25: qty 50

## 2022-09-25 NOTE — Progress Notes (Signed)
PROGRESS NOTE    Dawn Henderson  NGE:[id number] DOB: 01-11-1935 DOA: 09/23/2022 PCP: Lorelei Pont, DO   Brief Narrative:  Dawn Henderson is a 88 y.o. female with medical history significant of tachybradycardia syndrome status post pacemaker, osteoarthritis, HTN comes to the hospital from [facility] for AKI and ongoing diverticulitis with questionable failure of antibiotics/therapy (Cipro/flagyl) in the outpatient setting.  Assessment & Plan:   Principal Problem:   AKI (acute kidney injury) (HCC) Active Problems:   Sigmoid diverticulitis  Acute kidney injury, profound - Baseline 1.0 - Creatinine continues to rise - continue IVF until PO intake improves  -Continue LR @125  - Strict I/Os  Acute sigmoid diverticulitis, uncomplicated - continue ceftriaxone/metronidazole - Continue IVF until PO intake improves given below - slowly advance diet as tolerated -currently on bland diet - CT abd/pelvis confirms acute inflammation/diverticulitis without clear abscess or perforation  HypoMAG -Repleted, follow repeat labs  History of tachybradycardia syndrome - Follow-up outpatient cardiology for pacemaker management. -Home metoprolol 50mg TID - start 12.5 TID and titrate to maintain heart rate (transiently elevated overnight)  Hiatal hernia - Continue PPI  DVT prophylaxis: heparin injection 5,000 Units Start: 09/23/22 1400 Code Status:   Code Status: DNR Family Communication: At bedside  Status is: Inpt  Dispo: The patient is from: Home              Anticipated d/c is to: Home              Anticipated d/c date is: 24-48h              Patient currently NOT medically stable for discharge  Consultants:  None  Procedures:  None  Antimicrobials:  Ceftriaxone/Metronidazole   Subjective: No acute issues/events overnight - abdominal pain improving but not resolved. Nausea improving - requesting diet advancement which is certainly reasonable.  Objective: Vitals:   09/24/22 1354  09/24/22 1754 09/24/22 2023 09/25/22 0437  BP: (!) 124/59 124/71 (!) 147/85 138/74  Pulse: 72 82 69 72  Resp: 17  17 17   Temp: 98.4 F (36.9 C)  98.2 F (36.8 C) 98.3 F (36.8 C)  TempSrc: Oral  Oral Oral  SpO2: 99%  95%   Height:        Intake/Output Summary (Last 24 hours) at 09/25/2022 0744 Last data filed at 09/24/2022 4010 Gross per 24 hour  Intake 100 ml  Output --  Net 100 ml   There were no vitals filed for this visit.  Examination:  General exam: Appears calm and comfortable  Respiratory system: Clear to auscultation. Respiratory effort normal. Cardiovascular system: S1 & S2 heard, RRR. No JVD, murmurs, rubs, gallops or clicks. No pedal edema. Gastrointestinal system: Abdomen is nondistended, soft and nontender. No organomegaly or masses felt. Normal bowel sounds heard. Central nervous system: Alert and oriented. No focal neurological deficits. Extremities: Symmetric 5 x 5 power. Skin: No rashes, lesions or ulcers Psychiatry: Judgement and insight appear normal. Mood & affect appropriate.   Data Reviewed: I have personally reviewed following labs and imaging studies  CBC: Recent Labs  Lab 09/18/22 1233 09/22/22 2206 09/23/22 1119 09/24/22 0124  WBC 6.6 5.8 5.0 4.1  NEUTROABS 4.7  --   --   --   HGB 11.9* 12.0 11.6* 10.8*  HCT 38.3 37.2 35.7* 33.9*  MCV 91.4 90.7 90.6 88.7  PLT 101* 159 141* 133*   Basic Metabolic Panel: Recent Labs  Lab 09/18/22 1233 09/22/22 2206 09/23/22 1119 09/24/22 0124  NA 138 135  --  132*  K 4.3 3.8  --  3.8  CL 101 99  --  100  CO2 29 26  --  23  GLUCOSE 118* 152*  --  126*  BUN 18 28*  --  26*  CREATININE 1.04* 3.50* 4.20* 4.70*  CALCIUM 9.3 8.9  --  7.7*  MG  --   --   --  1.2*   GFR: Estimated Creatinine Clearance: 9.6 mL/min (A) (by C-G formula based on SCr of 4.7 mg/dL (H)). Liver Function Tests: Recent Labs  Lab 09/18/22 1233 09/22/22 2206  AST 16 19  ALT 10 11  ALKPHOS 61 53  BILITOT 0.9 0.7  PROT 6.6  6.6  ALBUMIN 3.5 3.4*   Recent Labs  Lab 09/18/22 1233 09/22/22 2206  LIPASE 29 30    No results found for this or any previous visit (from the past 240 hour(s)).       Radiology Studies: CT ABDOMEN PELVIS WO CONTRAST  Result Date: 09/23/2022 CLINICAL DATA:  Abdominal pain, acute, nonlocalized EXAM: CT ABDOMEN AND PELVIS WITHOUT CONTRAST TECHNIQUE: Multidetector CT imaging of the abdomen and pelvis was performed following the standard protocol without IV contrast. RADIATION DOSE REDUCTION: This exam was performed according to the departmental dose-optimization program which includes automated exposure control, adjustment of the mA and/or kV according to patient size and/or use of iterative reconstruction technique. COMPARISON:  09/18/2022 FINDINGS: Lower chest: No acute abnormality. Hepatobiliary: No focal liver abnormality is seen. Status post cholecystectomy. No biliary dilatation. Pancreas: Unremarkable Spleen: Unremarkable Adrenals/Urinary Tract: The adrenal glands are unremarkable. The kidneys are normal in size and position. There is progressive, mild bilateral perinephric stranding which may be inflammatory in nature and reflective of an underlying nephritis. No hydronephrosis. 2 mm nonobstructing calculus noted within the lower pole of the left kidney. No ureteral calculi allowing for obscuration of the distal right ureter by streak artifact. The visualized bladder is unremarkable. Stomach/Bowel: Moderate sigmoid diverticulosis. There is very mild peri-diverticular inflammatory stranding involving the proximal sigmoid colon best seen on axial image # 70/3 and coronal image # 91/6 in keeping with changes of very mild uncomplicated sigmoid diverticulitis. This appears improved since prior examination. The stomach, small bowel, and large bowel are unremarkable and there is no evidence of obstruction or perforation. No free intraperitoneal gas or fluid. No loculated intra-abdominal fluid  collections. Appendix normal. Vascular/Lymphatic: Aortic atherosclerosis. No enlarged abdominal or pelvic lymph nodes. Reproductive: Status post hysterectomy. No adnexal masses. Other: No abdominal wall hernia Musculoskeletal: Right total hip arthroplasty has been performed. Severe left hip degenerative arthritis. Osseous structures are diffusely osteopenic. No acute bone abnormality. No suspicious lytic or blastic bone lesion identified. IMPRESSION: 1. Moderate sigmoid diverticulosis with very mild peri-diverticular inflammatory stranding involving the proximal sigmoid colon in keeping with changes of very mild uncomplicated sigmoid diverticulitis. This appears improved since prior examination. 2. Progressive, mild bilateral perinephric stranding which may be inflammatory in nature and reflective of an underlying nephritis. Correlation with renal function tests and urinalysis may be helpful. 3. 2 mm nonobstructing calculus within the lower pole of the left kidney. 4. Aortic atherosclerosis. Aortic Atherosclerosis (ICD10-I70.0). Electronically Signed   By: Helyn Numbers M.D.   On: 09/23/2022 19:52        Scheduled Meds:  heparin  5,000 Units Subcutaneous Q8H   metoprolol tartrate  12.5 mg Oral TID   pantoprazole  40 mg Oral Daily   Continuous Infusions:  cefTRIAXone (ROCEPHIN)  IV 2 g (09/24/22 1348)   metronidazole 500 mg (  09/24/22 2214)     LOS: 2 days   Time spent:  Azucena Fallen, DO [facility]  If 7PM-7AM, please contact night-coverage [email or web address]  09/25/2022, 7:44 AM

## 2022-09-25 NOTE — Plan of Care (Signed)
  Problem: Education: Goal: Knowledge of General Education information will improve Description: Including pain rating scale, medication(s)/side effects and non-pharmacologic comfort measures Outcome: Progressing   Problem: Clinical Measurements: Goal: Ability to maintain clinical measurements within normal limits will improve Outcome: Progressing   Problem: Nutrition: Goal: Adequate nutrition will be maintained Outcome: Progressing   Problem: Pain Managment: Goal: General experience of comfort will improve Outcome: Progressing   Problem: Skin Integrity: Goal: Risk for impaired skin integrity will decrease Outcome: Progressing

## 2022-09-25 NOTE — Care Management Note (Signed)
8/9 The wrong Medicare Rights Letter is filed under this patient. Dawn Henderson is the patient in this file.

## 2022-09-26 DIAGNOSIS — N179 Acute kidney failure, unspecified: Secondary | ICD-10-CM | POA: Diagnosis not present

## 2022-09-26 MED ORDER — TRAMADOL HCL 50 MG PO TABS
50.0000 mg | ORAL_TABLET | Freq: Once | ORAL | Status: DC
  Filled 2022-09-26 (×2): qty 1

## 2022-09-26 NOTE — Progress Notes (Signed)
PROGRESS NOTE    Dawn Henderson  XTG:[id number] DOB: Jun 25, 1934 DOA: 09/23/2022 PCP: Lorelei Pont, DO   Brief Narrative:  Dawn Henderson is a 88 y.o. female with medical history significant of tachybradycardia syndrome status post pacemaker, osteoarthritis, HTN comes to the hospital from [facility] for AKI and ongoing diverticulitis with questionable failure of antibiotics/therapy (Cipro/flagyl) in the outpatient setting.  Assessment & Plan:   Principal Problem:   AKI (acute kidney injury) (HCC) Active Problems:   Sigmoid diverticulitis  Acute kidney injury, profound - Baseline 1.0 - Creatinine finally downtrending, continue to follow -Continue LR @125  - Strict I/Os  Acute sigmoid diverticulitis, uncomplicated - continue ceftriaxone/metronidazole - Continue IVF until PO intake improves given below - slowly advance diet as tolerated -currently on bland diet - CT abd/pelvis confirms acute inflammation/diverticulitis without clear abscess or perforation  HypoMAG -Repleted, follow repeat labs  History of tachybradycardia syndrome - Follow-up outpatient cardiology for pacemaker management. -Home metoprolol 50mg TID - start 12.5 TID and titrate to maintain heart rate (transiently elevated overnight)  Hiatal hernia - Continue PPI  DVT prophylaxis: heparin injection 5,000 Units Start: 09/23/22 1400 Code Status:   Code Status: DNR Family Communication: At bedside  Status is: Inpt  Dispo: The patient is from: Home              Anticipated d/c is to: Home              Anticipated d/c date is: 24-48h pending improvement in renal function              Patient currently NOT medically stable for discharge given need for close monitoring and IV fluids  Consultants:  None  Procedures:  None  Antimicrobials:  Ceftriaxone/Metronidazole   Subjective: No acute issues/events overnight -patient reports cough with clear phlegm but otherwise denies shortness of breath nausea vomiting  diarrhea or constipation.  Objective: Vitals:   09/25/22 0743 09/25/22 1525 09/25/22 1900 09/26/22 0500  BP: 113/79 131/63 (!) 151/78 107/89  Pulse: 88 60 64 75  Resp: 16 16 16 15   Temp: 97.6 F (36.4 C) 98.2 F (36.8 C) 98.3 F (36.8 C) 98.6 F (37 C)  TempSrc: Oral Oral Oral Oral  SpO2: 96% 98% 97% 97%  Height:        Intake/Output Summary (Last 24 hours) at 09/26/2022 2703 Last data filed at 09/26/2022 0500 Gross per 24 hour  Intake 440 ml  Output 600 ml  Net -160 ml   There were no vitals filed for this visit.  Examination:  General exam: Appears calm and comfortable  Respiratory system: Clear to auscultation. Respiratory effort normal. Cardiovascular system: S1 & S2 heard, RRR. No JVD, murmurs, rubs, gallops or clicks. No pedal edema. Gastrointestinal system: Abdomen is nondistended, soft and nontender. No organomegaly or masses felt. Normal bowel sounds heard. Central nervous system: Alert and oriented. No focal neurological deficits. Extremities: Symmetric 5 x 5 power. Skin: No rashes, lesions or ulcers Psychiatry: Judgement and insight appear normal. Mood & affect appropriate.   Data Reviewed: I have personally reviewed following labs and imaging studies  CBC: Recent Labs  Lab 09/22/22 2206 09/23/22 1119 09/24/22 0124  WBC 5.8 5.0 4.1  HGB 12.0 11.6* 10.8*  HCT 37.2 35.7* 33.9*  MCV 90.7 90.6 88.7  PLT 159 141* 133*   Basic Metabolic Panel: Recent Labs  Lab 09/22/22 2206 09/23/22 1119 09/24/22 0124 09/25/22 0830  NA 135  --  132* 132*  K 3.8  --  3.8 3.9  CL 99  --  100 103  CO2 26  --  23 20*  GLUCOSE 152*  --  126* 115*  BUN 28*  --  26* 26*  CREATININE 3.50* 4.20* 4.70* 5.55*  CALCIUM 8.9  --  7.7* 7.9*  MG  --   --  1.2*  --    GFR: Estimated Creatinine Clearance: 8.2 mL/min (A) (by C-G formula based on SCr of 5.55 mg/dL (H)). Liver Function Tests: Recent Labs  Lab 09/22/22 2206  AST 19  ALT 11  ALKPHOS 53  BILITOT 0.7  PROT 6.6   ALBUMIN 3.4*   Recent Labs  Lab 09/22/22 2206  LIPASE 30    No results found for this or any previous visit (from the past 240 hour(s)).       Radiology Studies: No results found.      Scheduled Meds:  heparin  5,000 Units Subcutaneous Q8H   metoprolol tartrate  12.5 mg Oral TID   pantoprazole  40 mg Oral Daily   Continuous Infusions:  cefTRIAXone (ROCEPHIN)  IV 2 g (09/25/22 1305)   lactated ringers 125 mL/hr at 09/25/22 1737   metronidazole 500 mg (09/26/22 0053)     LOS: 3 days   Time spent:  Azucena Fallen, DO [facility]  If 7PM-7AM, please contact night-coverage [email or web address]  09/26/2022, 7:52 AM

## 2022-09-26 NOTE — Plan of Care (Signed)
  Problem: Education: Goal: Knowledge of General Education information will improve Description: Including pain rating scale, medication(s)/side effects and non-pharmacologic comfort measures Outcome: Progressing   Problem: Activity: Goal: Risk for activity intolerance will decrease Outcome: Progressing   Problem: Pain Managment: Goal: General experience of comfort will improve Outcome: Not Progressing

## 2022-09-27 DIAGNOSIS — N179 Acute kidney failure, unspecified: Secondary | ICD-10-CM | POA: Diagnosis not present

## 2022-09-27 LAB — BASIC METABOLIC PANEL
Anion gap: 12 (ref 5–15)
BUN: 23 mg/dL (ref 8–23)
CO2: 19 mmol/L — ABNORMAL LOW (ref 22–32)
Calcium: 8.3 mg/dL — ABNORMAL LOW (ref 8.9–10.3)
Chloride: 96 mmol/L — ABNORMAL LOW (ref 98–111)
Creatinine, Ser: 5.41 mg/dL — ABNORMAL HIGH (ref 0.44–1.00)
GFR, Estimated: 7 mL/min — ABNORMAL LOW (ref >60)
Glucose, Bld: 105 mg/dL — ABNORMAL HIGH (ref 70–99)
Potassium: 4.5 mmol/L (ref 3.5–5.1)
Sodium: 127 mmol/L — ABNORMAL LOW (ref 135–145)

## 2022-09-27 MED ORDER — TRAMADOL HCL 50 MG PO TABS
50.0000 mg | ORAL_TABLET | Freq: Once | ORAL | Status: AC
  Administered 2022-09-27: 50 mg via ORAL
  Filled 2022-09-27: qty 1

## 2022-09-27 NOTE — Plan of Care (Signed)
?  Problem: Clinical Measurements: ?Goal: Respiratory complications will improve ?Outcome: Progressing ?  ?Problem: Activity: ?Goal: Risk for activity intolerance will decrease ?Outcome: Progressing ?  ?Problem: Elimination: ?Goal: Will not experience complications related to urinary retention ?Outcome: Progressing ?  ?

## 2022-09-27 NOTE — Progress Notes (Signed)
PROGRESS NOTE    Dawn Henderson  ZOX:[id number] DOB: 11/12/1934 DOA: 09/23/2022 PCP: Lorelei Pont, DO   Brief Narrative:  Dawn Henderson is a 88 y.o. female with medical history significant of tachybradycardia syndrome status post pacemaker, osteoarthritis, HTN comes to the hospital from [facility] for AKI and ongoing diverticulitis with questionable failure of antibiotics/therapy (Cipro/flagyl) in the outpatient setting.  Assessment & Plan:   Principal Problem:   AKI (acute kidney injury) (HCC) Active Problems:   Sigmoid diverticulitis  Acute kidney injury, profound, improving - Baseline 1.0 - Creatinine finally downtrending, continue to follow -Continue LR -decrease rate to 75 cc/hr - Strict I/Os  Acute sigmoid diverticulitis, uncomplicated - continue ceftriaxone/metronidazole - Continue IVF until PO intake improves given below - slowly advance diet as tolerated -currently on bland diet - CT abd/pelvis confirms acute inflammation/diverticulitis without clear abscess or perforation  HypoMAG -Repleted, follow repeat labs  History of tachybradycardia syndrome - Follow-up outpatient cardiology for pacemaker management. -Home metoprolol 50mg TID - start 12.5 TID and titrate to maintain heart rate (transiently elevated overnight)  Hiatal hernia - Continue PPI  DVT prophylaxis: heparin injection 5,000 Units Start: 09/23/22 1400 Code Status:   Code Status: DNR Family Communication: At bedside  Status is: Inpt  Dispo: The patient is from: Home              Anticipated d/c is to: Home              Anticipated d/c date is: 24-48h pending improvement in renal function              Patient currently NOT medically stable for discharge given need for close monitoring and IV fluids  Consultants:  None  Procedures:  None  Antimicrobials:  Ceftriaxone/Metronidazole   Subjective: No acute issues/events overnight -patient reports ongoing nonproductive cough but improving.   Denies nausea vomiting diarrhea constipation headache fevers chills or chest pain..  Objective: Vitals:   09/26/22 0500 09/26/22 0700 09/26/22 2123 09/27/22 0514  BP: 107/89 (!) 148/82 (!) 175/74 (!) 150/86  Pulse: 75 80 65 100  Resp: 15 18 18 18   Temp: 98.6 F (37 C) 97.8 F (36.6 C) 98 F (36.7 C) 98.8 F (37.1 C)  TempSrc: Oral Oral Oral Oral  SpO2: 97% 96% 99% 96%  Height:        Intake/Output Summary (Last 24 hours) at 09/27/2022 8119 Last data filed at 09/27/2022 1478 Gross per 24 hour  Intake 600 ml  Output 1200 ml  Net -600 ml   There were no vitals filed for this visit.  Examination:  General:  Pleasantly resting in bed, No acute distress. HEENT:  Normocephalic atraumatic.  Sclerae nonicteric, noninjected.  Extraocular movements intact bilaterally. Neck:  Without mass or deformity.  Trachea is midline. Lungs:  Clear to auscultate bilaterally without rhonchi, wheeze, or rales. Heart:  Regular rate and rhythm.  Without murmurs, rubs, or gallops. Abdomen:  Soft, minimally tender suprapubic, nondistended without guarding or rebound. Extremities: Without cyanosis, clubbing, edema, or obvious deformity. Skin:  Warm and dry, no erythema.  Data Reviewed: I have personally reviewed following labs and imaging studies  CBC: Recent Labs  Lab 09/22/22 2206 09/23/22 1119 09/24/22 0124 09/26/22 0926  WBC 5.8 5.0 4.1 5.7  HGB 12.0 11.6* 10.8* 12.1  HCT 37.2 35.7* 33.9* 37.2  MCV 90.7 90.6 88.7 89.9  PLT 159 141* 133* 129*   Basic Metabolic Panel: Recent Labs  Lab 09/22/22 2206 09/23/22 1119 09/24/22 0124 09/25/22 0830 09/26/22 2956  NA 135  --  132* 132* 129*  K 3.8  --  3.8 3.9 4.0  CL 99  --  100 103 99  CO2 26  --  23 20* 22  GLUCOSE 152*  --  126* 115* 145*  BUN 28*  --  26* 26* 24*  CREATININE 3.50* 4.20* 4.70* 5.55* 5.44*  CALCIUM 8.9  --  7.7* 7.9* 8.5*  MG  --   --  1.2*  --   --    GFR: Estimated Creatinine Clearance: 8.3 mL/min (A) (by C-G  formula based on SCr of 5.44 mg/dL (H)). Liver Function Tests: Recent Labs  Lab 09/22/22 2206  AST 19  ALT 11  ALKPHOS 53  BILITOT 0.7  PROT 6.6  ALBUMIN 3.4*   Recent Labs  Lab 09/22/22 2206  LIPASE 30    No results found for this or any previous visit (from the past 240 hour(s)).       Radiology Studies: No results found.      Scheduled Meds:  heparin  5,000 Units Subcutaneous Q8H   metoprolol tartrate  12.5 mg Oral TID   pantoprazole  40 mg Oral Daily   Continuous Infusions:  cefTRIAXone (ROCEPHIN)  IV 2 g (09/26/22 1257)   lactated ringers 125 mL/hr at 09/25/22 1737   metronidazole 500 mg (09/27/22 0001)     LOS: 4 days   Time spent:  Azucena Fallen, DO [facility]  If 7PM-7AM, please contact night-coverage [email or web address]  09/27/2022, 8:08 AM

## 2022-09-28 ENCOUNTER — Inpatient Hospital Stay (HOSPITAL_COMMUNITY): Payer: Medicare Other

## 2022-09-28 DIAGNOSIS — N179 Acute kidney failure, unspecified: Secondary | ICD-10-CM | POA: Diagnosis not present

## 2022-09-28 LAB — TSH: TSH: 4.718 u[IU]/mL — ABNORMAL HIGH (ref 0.350–4.500)

## 2022-09-28 LAB — CORTISOL: Cortisol, Plasma: 9.4 ug/dL

## 2022-09-28 NOTE — Consult Note (Signed)
Reason for Consult: Renal failure Referring Physician:  Dr Carma Leaven  Chief Complaint:  Abdominal pain  Assessment/Plan: Renal failure: may be from contrast induced nephropathy; she was possibly prerenal at that time with abdominal pain and poor oral input. No obstruction on CT on 8/7 with only a small nephrolith noted on the lower side.  - For now supportive care and hopefully we don't need to make the decision about dialysis in the future.  Will check renal ultrasound mainly for size of kidneys but not expecting obstruction with a neg CT at admission when she had renal failure. - Will stop the the IVF for now; encouraged her to increased PO intake. - Will follow closely with you.  -Monitor Daily I/Os, Need daily weights  -Maintain MAP>65 for optimal renal perfusion.  - Avoid nephrotoxic agents such as IV contrast, NSAIDs, and phosphate containing bowel preps (FLEETS)  Hyponatremia: likely secondary to renal failure and poor solute intake + free water intake primarily +  component of SIADH from nausea and pain. - Fluid restrict to 1L per day and will follow closely with you. - Will check TSH, cortisol, and urine studies. Tachybrady syndrome with PM. Diverticultities: on Rocephin and Flagyl.   HPI: Dawn Henderson is an 88 y.o. female  tachybrady syndrome with [employer] Jude pacemaker (07/2014), OA, PAD, HTN, urinary incontinence initially presented to [facility] for renal failure. She had presented prior to that to [facility] with abdominal pain diagnosed with sigmoid diverticulitis and treated with antibiotics (cipro + flagyl). Unfortunately she did not improve on that regimen and then had fevers with subsequent worsening of renal function. She did receive IV contrast ( of Iohexol) at the initial presentation on 8/2 and next labs were on 8/6 when Cr. was 3.5. She states that she has had nausea but not every day but denies any vomiting; abdominal pain still present but more in the lower  abdomen. She denies any shortness of breath, sore throat but has had a poor appetite; since having COVID many years ago she has not had a good palate with no taste from the food. She has had some decreased UOP after the contrast which she attributed to the antibiotics. She only uses excedrin for H/A and denies any NSAID use. She also denies dysuria but has had to use the restroom more frequently at night but does not feel full afterwards.    Urinalysis showed ketones with no proteinuria and no Hb, LE or nitrites.    ROS Pertinent items are noted in HPI.  Chemistry and CBC: Creatinine, Ser  Date/Time Value Ref Range Status  09/28/2022 06:16 AM 5.45 (H) 0.44 - 1.00 mg/dL Final  40/98/1191 47:82 AM 5.41 (H) 0.44 - 1.00 mg/dL Final  95/62/1308 65:78 AM 5.44 (H) 0.44 - 1.00 mg/dL Final  46/96/2952 84:13 AM 5.55 (H) 0.44 - 1.00 mg/dL Final  24/40/1027 25:36 AM 4.70 (H) 0.44 - 1.00 mg/dL Final  64/40/3474 25:95 AM 4.20 (H) 0.44 - 1.00 mg/dL Final  63/87/5643 32:95 PM 3.50 (H) 0.44 - 1.00 mg/dL Final  18/84/1660 63:01 PM 1.04 (H) 0.44 - 1.00 mg/dL Final  60/11/9321 55:73 AM 0.89 0.44 - 1.00 mg/dL Final  22/03/5425 06:23 AM 0.99 0.44 - 1.00 mg/dL Final  76/28/3151 76:16 PM 0.86 0.44 - 1.00 mg/dL Final   Recent Labs  Lab 09/22/22 2206 09/23/22 1119 09/24/22 0124 09/25/22 0830 09/26/22 0926 09/27/22 1109 09/28/22 0616  NA 135  --  132* 132* 129* 127* 127*  K 3.8  --  3.8 3.9 4.0 4.5 4.6  CL 99  --  100 103 99 96* 100  CO2 26  --  23 20* 22 19* 17*  GLUCOSE 152*  --  126* 115* 145* 105* 89  BUN 28*  --  26* 26* 24* 23 23  CREATININE 3.50* 4.20* 4.70* 5.55* 5.44* 5.41* 5.45*  CALCIUM 8.9  --  7.7* 7.9* 8.5* 8.3* 8.2*   Recent Labs  Lab 09/23/22 1119 09/24/22 0124 09/26/22 0926 09/28/22 0616  WBC 5.0 4.1 5.7 4.8  HGB 11.6* 10.8* 12.1 10.7*  HCT 35.7* 33.9* 37.2 33.0*  MCV 90.6 88.7 89.9 89.9  PLT 141* 133* 129* 115*   Liver Function Tests: Recent Labs  Lab 09/22/22 2206   AST 19  ALT 11  ALKPHOS 53  BILITOT 0.7  PROT 6.6  ALBUMIN 3.4*   Recent Labs  Lab 09/22/22 2206  LIPASE 30   No results for input(s): "AMMONIA" in the last 168 hours. Cardiac Enzymes: No results for input(s): "CKTOTAL", "CKMB", "CKMBINDEX", "TROPONINI" in the last 168 hours. Iron Studies: No results for input(s): "IRON", "TIBC", "TRANSFERRIN", "FERRITIN" in the last 72 hours. PT/INR: @LABRCNTIP (inr:5)  Xrays/Other Studies: ) Results for orders placed or performed during the hospital encounter of 09/23/22 (from the past 48 hour(s))  Basic metabolic panel     Status: Abnormal   Collection Time: 09/27/22 11:09 AM  Result Value Ref Range   Sodium 127 (L) 135 - 145 mmol/L   Potassium 4.5 3.5 - 5.1 mmol/L   Chloride 96 (L) 98 - 111 mmol/L   CO2 19 (L) 22 - 32 mmol/L   Glucose, Bld 105 (H) 70 - 99 mg/dL    Comment: Glucose reference range applies only to samples taken after fasting for at least 8 hours.   BUN 23 8 - 23 mg/dL   Creatinine, Ser 1.61 (H) 0.44 - 1.00 mg/dL   Calcium 8.3 (L) 8.9 - 10.3 mg/dL   GFR, Estimated 7 (L) >60 mL/min    Comment: (NOTE) Calculated using the CKD-EPI Creatinine Equation (2021)    Anion gap 12 5 - 15    Comment: Performed at [facility], [address] [place], [place] [address]  CBC     Status: Abnormal   Collection Time: 09/28/22  6:16 AM  Result Value Ref Range   WBC 4.8 4.0 - 10.5 K/uL   RBC 3.67 (L) 3.87 - 5.11 MIL/uL   Hemoglobin 10.7 (L) 12.0 - 15.0 g/dL   HCT 54.0 (L) 98.1 - 19.1 %   MCV 89.9 80.0 - 100.0 fL   MCH 29.2 26.0 - 34.0 pg   MCHC 32.4 30.0 - 36.0 g/dL   RDW 47.8 29.5 - 62.1 %   Platelets 115 (L) 150 - 400 K/uL   nRBC 0.0 0.0 - 0.2 %    Comment: Performed at [facility], [address], [place], [place] [address]  Basic metabolic panel     Status: Abnormal   Collection Time: 09/28/22  6:16 AM  Result Value Ref Range   Sodium 127 (L) 135 - 145 mmol/L   Potassium 4.6 3.5 - 5.1 mmol/L   Chloride 100  98 - 111 mmol/L   CO2 17 (L) 22 - 32 mmol/L   Glucose, Bld 89 70 - 99 mg/dL    Comment: Glucose reference range applies only to samples taken after fasting for at least 8 hours.   BUN 23 8 - 23 mg/dL   Creatinine, Ser 7.84 (H)  0.44 - 1.00 mg/dL   Calcium 8.2 (L) 8.9 - 10.3 mg/dL   GFR, Estimated 7 (L) >60 mL/min    Comment: (NOTE) Calculated using the CKD-EPI Creatinine Equation (2021)    Anion gap 10 5 - 15    Comment: Performed at [facility], [address] [place], [place] [address]   No results found.  PMH:   Past Medical History:  Diagnosis Date   Arthritis    History of hiatal hernia    small    Mass in neck Nov. 21, 2014   Right - enlarged and very tender   Mitral valve insufficiency    Palpitations    Peripheral vascular disease (HCC)    Presence of permanent cardiac pacemaker    ST Jude. 08-07-14 signed cardiac orders with chart -Dr. Danella Maiers.   SSS (sick sinus syndrome) (HCC)    SVT (supraventricular tachycardia)    Urinary incontinence    " leaks when stands up"     PSH:   Past Surgical History:  Procedure Laterality Date   ABDOMINAL HYSTERECTOMY     CHOLECYSTECTOMY     INSERT / REPLACE / REMOVE PACEMAKER     TOTAL HIP ARTHROPLASTY Right 08/14/2014   Procedure: RIGHT TOTAL HIP ARTHROPLASTY ANTERIOR APPROACH;  Surgeon: Durene Romans, MD;  Location: [facility];  Service: Orthopedics;  Laterality: Right;    Allergies:  Allergies  Allergen Reactions   Sulfa Antibiotics Other (See Comments)    Can not remember rxn.    Penicillins Rash    Small rash in her 20's    Medications:   Prior to Admission medications   Medication Sig Start Date End Date Taking? Authorizing Provider  cyanocobalamin (VITAMIN B12) 1000 MCG/ML injection Inject 1,000 mcg into the skin every 30 (thirty) days. 07/02/22  Yes [provider]  metoprolol (LOPRESSOR) 50 MG tablet Take 50 mg by mouth 3 (three) times daily.   Yes [provider]  omeprazole (PRILOSEC)  40 MG capsule Take 40 mg by mouth daily. 08/19/22  Yes [provider]    Discontinued Meds:   Medications Discontinued During This Encounter  Medication Reason   cefTRIAXone (ROCEPHIN) 1 g in sodium chloride 0.9 % 100 mL IVPB    polyethylene glycol (MIRALAX / GLYCOLAX) packet    loratadine (CLARITIN) 10 MG tablet    docusate sodium (COLACE) 100 MG capsule    omeprazole (PRILOSEC) 20 MG capsule Dose change   tiZANidine (ZANAFLEX) 4 MG tablet Patient Preference   rivaroxaban (XARELTO) 10 MG TABS tablet Discontinued by provider   HYDROcodone-acetaminophen (NORCO) 7.5-325 MG per tablet Patient Preference   ferrous sulfate 325 (65 FE) MG tablet Patient Preference   ciprofloxacin (CIPRO) 500 MG tablet    metroNIDAZOLE (FLAGYL) 500 MG tablet    traMADol (ULTRAM) tablet 50 mg     Social History:  reports that she has never smoked. She has never used smokeless tobacco. She reports that she does not drink alcohol and does not use drugs.  Family History:  No family history on file.  Blood pressure (!) 161/86, pulse 70, temperature 98 F (36.7 C), temperature source Oral, resp. rate 17, height 5\' 8"  (1.727 m), SpO2 98%. General appearance: alert, cooperative, and appears stated age Head: Normocephalic, without obvious abnormality, atraumatic Eyes: negative Neck: no adenopathy, no carotid bruit, no JVD, supple, symmetrical, trachea midline, and thyroid not enlarged, symmetric, no tenderness/mass/nodules Back: symmetric, no curvature. ROM normal. No CVA tenderness. Resp: clear to auscultation bilaterally Cardio:  regular rate and rhythm GI: soft, non-tender; bowel sounds normal; no masses,  no organomegaly Extremities: edema 1+ Pulses: 2+ and symmetric Skin: Skin color, texture, turgor normal. No rashes or lesions GU: no foley       Ethelene Hal, MD 09/28/2022, 11:14 AM

## 2022-09-28 NOTE — Progress Notes (Signed)
PROGRESS NOTE    Dawn Henderson  ZOX:[id number] DOB: 02/15/1935 DOA: 09/23/2022 PCP: Lorelei Pont, DO   Brief Narrative:  Dawn Henderson is a 88 y.o. female with medical history significant of tachybradycardia syndrome status post pacemaker, osteoarthritis, HTN comes to the hospital from [facility] for AKI and ongoing diverticulitis with questionable failure of antibiotics/therapy (Cipro/flagyl) in the outpatient setting.  Assessment & Plan:   Principal Problem:   AKI (acute kidney injury) (HCC) Active Problems:   Sigmoid diverticulitis  Acute kidney injury, profound, stabilizing - Baseline 1.0 - Creatinine minimally downtrending yesterday now appears to be stable with increasing volume status -Nephrology consulted for additional insight recommendations given poor renal function and clearance despite IV fluids -Renal ultrasound pending -Hold further IV fluids for now, increase p.o. intake - Strict I/Os and daily weights  Acute sigmoid diverticulitis, uncomplicated, resolved - continue ceftriaxone/metronidazole - Continue IVF until PO intake improves given below - slowly advance diet as tolerated -currently on bland diet - CT abd/pelvis confirms acute inflammation/diverticulitis without clear abscess or perforation  Hypomagnesemia -Repleted, follow repeat labs  History of tachybradycardia syndrome - Follow-up outpatient cardiology for pacemaker management. -Home metoprolol 50mg TID - start 12.5 TID and titrate to maintain heart rate (transiently elevated overnight)  Hiatal hernia - Continue PPI  DVT prophylaxis: heparin injection 5,000 Units Start: 09/23/22 1400 Code Status:   Code Status: DNR Family Communication: At bedside  Status is: Inpt  Dispo: The patient is from: Home              Anticipated d/c is to: Home              Anticipated d/c date is: 24-48h pending improvement in renal function              Patient currently NOT medically stable for discharge given  need for close monitoring and IV fluids  Consultants:  None  Procedures:  None  Antimicrobials:  Ceftriaxone/Metronidazole   Subjective: Patient reports general malaise, worsening lower extremity edema denies orthopnea or shortness of breath with exertion but notes shorter walking distances today secondary to additional weight in her lower extremities.  Denies nausea vomiting diarrhea constipation headache fevers chills chest pain shortness of breath.  Objective: Vitals:   09/27/22 0831 09/27/22 1456 09/27/22 2001 09/28/22 0425  BP: 127/71 139/69 (!) 175/77 (!) 146/68  Pulse: 75 60 86 76  Resp: 18 17 16 16   Temp: 98 F (36.7 C) 98.2 F (36.8 C)    TempSrc: Oral Oral    SpO2: 97% 100% 98% 97%  Height:        Intake/Output Summary (Last 24 hours) at 09/28/2022 0759 Last data filed at 09/27/2022 1953 Gross per 24 hour  Intake --  Output 500 ml  Net -500 ml   There were no vitals filed for this visit.  Examination:  General:  Pleasantly resting in bed, No acute distress. HEENT:  Normocephalic atraumatic.  Sclerae nonicteric, noninjected.  Extraocular movements intact bilaterally. Neck:  Without mass or deformity.  Trachea is midline. Lungs:  Clear to auscultate bilaterally without rhonchi, wheeze, or rales. Heart:  Regular rate and rhythm.  Without murmurs, rubs, or gallops. Abdomen:  Soft, minimally tender suprapubic, nondistended without guarding or rebound. Extremities: Without cyanosis, clubbing, 2+ pitting edema bilateral lower extremities distal to the knees Skin:  Warm and dry, no erythema.  Chronic skin changes bilateral lower extremity consistent with chronic edema  Data Reviewed: I have personally reviewed following labs and imaging studies  CBC:  Recent Labs  Lab 09/22/22 2206 09/23/22 1119 09/24/22 0124 09/26/22 0926 09/28/22 0616  WBC 5.8 5.0 4.1 5.7 4.8  HGB 12.0 11.6* 10.8* 12.1 10.7*  HCT 37.2 35.7* 33.9* 37.2 33.0*  MCV 90.7 90.6 88.7 89.9 89.9   PLT 159 141* 133* 129* 115*   Basic Metabolic Panel: Recent Labs  Lab 09/24/22 0124 09/25/22 0830 09/26/22 0926 09/27/22 1109 09/28/22 0616  NA 132* 132* 129* 127* 127*  K 3.8 3.9 4.0 4.5 4.6  CL 100 103 99 96* 100  CO2 23 20* 22 19* 17*  GLUCOSE 126* 115* 145* 105* 89  BUN 26* 26* 24* 23 23  CREATININE 4.70* 5.55* 5.44* 5.41* 5.45*  CALCIUM 7.7* 7.9* 8.5* 8.3* 8.2*  MG 1.2*  --   --   --   --    GFR: Estimated Creatinine Clearance: 8.3 mL/min (A) (by C-G formula based on SCr of 5.45 mg/dL (H)).  Liver Function Tests: Recent Labs  Lab 09/22/22 2206  AST 19  ALT 11  ALKPHOS 53  BILITOT 0.7  PROT 6.6  ALBUMIN 3.4*   Recent Labs  Lab 09/22/22 2206  LIPASE 30    No results found for this or any previous visit (from the past 240 hour(s)).   Radiology Studies: No results found.  Scheduled Meds:  heparin  5,000 Units Subcutaneous Q8H   metoprolol tartrate  12.5 mg Oral TID   pantoprazole  40 mg Oral Daily   Continuous Infusions:  cefTRIAXone (ROCEPHIN)  IV 2 g (09/27/22 1313)   lactated ringers 75 mL/hr at 09/27/22 1417   metronidazole 500 mg (09/28/22 0052)     LOS: 5 days   Time spent:  Azucena Fallen, DO [facility]  If 7PM-7AM, please contact night-coverage [email or web address]  09/28/2022, 7:59 AM

## 2022-09-28 NOTE — Plan of Care (Signed)

## 2022-09-28 NOTE — Care Management Important Message (Signed)
Important Message  Patient Details  Name: Remya Hernandezgarci MRN: [id number] Date of Birth: 1934/09/22   Medicare Important Message Given:  Yes     Sherilyn Banker 09/28/2022, 2:21 PM

## 2022-09-29 DIAGNOSIS — N179 Acute kidney failure, unspecified: Secondary | ICD-10-CM | POA: Diagnosis not present

## 2022-09-29 MED ORDER — ORAL CARE MOUTH RINSE: 15.0000 mL | OROMUCOSAL | Status: DC | PRN

## 2022-09-29 MED ORDER — METOPROLOL TARTRATE 12.5 MG HALF TABLET: 12.5000 mg | ORAL_TABLET | ORAL | Status: AC

## 2022-09-29 MED ORDER — METOPROLOL TARTRATE 25 MG PO TABS
25.0000 mg | ORAL_TABLET | Freq: Three times a day (TID) | ORAL | Status: DC
  Administered 2022-09-29 – 2022-10-07 (×24): 25 mg via ORAL
  Filled 2022-09-29 (×24): qty 1

## 2022-09-29 NOTE — Progress Notes (Signed)
Family wants to speak with provider regarding next steps following morning labs. Pt creatinine and BUN increased and pt has had urinary frequency throughout night. Output has been documented and pt/family aware of fluid restrict as well as strict I&o's.

## 2022-09-29 NOTE — Plan of Care (Signed)

## 2022-09-29 NOTE — Progress Notes (Signed)
   09/29/22 0947  Vitals  Temp 97.6 F (36.4 C)  Temp Source Oral  BP 119/78  MAP (mmHg) 91  BP Location Right Arm  BP Method Automatic  Patient Position (if appropriate) Sitting  Pulse Rate (!) 131  Pulse Rate Source Monitor  Resp 17  MEWS COLOR  MEWS Score Color Yellow  Oxygen Therapy  SpO2 99 %  O2 Device Room Air  MEWS Score  MEWS Temp 0  MEWS Systolic 0  MEWS Pulse 3  MEWS RR 0  MEWS LOC 0  MEWS Score 3  Provider Notification  Date Provider Notified 09/29/22  Method of Notification Face-to-face  Notification Reason Other (Comment) (tachycardia,  Pt has hx of tachy-brady syndrome)  Provider response See new orders  Date of Provider Response 09/29/22  Time of Provider Response 1015   Pt seen in room alert/oriented in no apparent distress, denies any chest pain/discomfort at this time,No complaints.  Niece at bedside. Attending MD at bedside.

## 2022-09-29 NOTE — Progress Notes (Signed)
PROGRESS NOTE    Dawn Henderson  TFT:[id number] DOB: 11-07-1934 DOA: 09/23/2022 PCP: Lorelei Pont, DO   Brief Narrative:  Dawn Henderson is a 88 y.o. female with medical history significant of tachybradycardia syndrome status post pacemaker, osteoarthritis, HTN comes to the hospital from [facility] for AKI and ongoing diverticulitis with questionable failure of antibiotics/therapy (Cipro/flagyl) in the outpatient setting.  Assessment & Plan:   Principal Problem:   AKI (acute kidney injury) (HCC) Active Problems:   Sigmoid diverticulitis  Acute kidney injury, profound, stabilizing - Baseline 1.0 - Creatinine currently stabilizing around 5.5 - Urine output remains appropriate - Nephrology consulted for additional insight recommendations given poor renal function and clearance despite IV fluids -Renal ultrasound unremarkable except for bilateral renal cysts -Hold further IV fluids for now, Strict I/Os and daily weights -Compression stockings ordered previously (8/11) but patient has yet to put them on.  Acute sigmoid diverticulitis, uncomplicated, resolved - continue ceftriaxone/metronidazole - 7-10 days pending symptoms -IV fluids on hold -continue diet advancement, currently tolerating regular diet well - CT abd/pelvis confirms acute inflammation/diverticulitis without clear abscess or perforation  Hypomagnesemia -Repleted, follow repeat labs  History of tachybradycardia syndrome - Follow-up outpatient cardiology for pacemaker management. - Home metoprolol 50mg TID - initiated on 12.5mg  TID due to borderline hypotension and titrate up as appropriate - Increased metoprolol to 25mg  TID today due to transient episode of tachy-dysrhythmia which improved with additional PO and IV metoprolol.  Hiatal hernia - Continue PPI  DVT prophylaxis: heparin injection 5,000 Units Start: 09/23/22 1400 Code Status:   Code Status: DNR Family Communication: At bedside  Status is:  Inpt  Dispo: The patient is from: Home              Anticipated d/c is to: Home              Anticipated d/c date is: 24-48h pending improvement in renal function              Patient currently NOT medically stable for discharge given need for close monitoring and IV fluids  Consultants:  None  Procedures:  None  Antimicrobials:  Ceftriaxone/Metronidazole -continue 7 to 10-day regimen pending clinical course  Subjective: Patient reports worsening bilateral lower extremity edema   general malaise, worsening lower extremity edema denies orthopnea or shortness of breath with exertion but notes shorter walking distances today secondary to additional weight in her lower extremities.  Denies nausea vomiting diarrhea constipation headache fevers chills chest pain shortness of breath.  Objective: Vitals:   09/28/22 1947 09/28/22 2150 09/29/22 0400 09/29/22 0408  BP: 100/88 (!) 140/83  131/82  Pulse: 69 74  89  Resp: 18   19  Temp: 98.4 F (36.9 C)   97.7 F (36.5 C)  TempSrc: Oral   Oral  SpO2: 100%   99%  Weight:   89.1 kg   Height:        Intake/Output Summary (Last 24 hours) at 09/29/2022 0752 Last data filed at 09/29/2022 0728 Gross per 24 hour  Intake 225 ml  Output 1550 ml  Net -1325 ml   Filed Weights   09/29/22 0400  Weight: 89.1 kg    Examination:  General:  Pleasantly resting in bed, No acute distress. HEENT:  Normocephalic atraumatic.  Sclerae nonicteric, noninjected.  Extraocular movements intact bilaterally. Neck:  Without mass or deformity.  Trachea is midline. Lungs:  Clear to auscultate bilaterally without rhonchi, wheeze, or rales. Heart:  Regular rate and rhythm.  Without murmurs, rubs, or  gallops. Abdomen:  Soft, minimally tender suprapubic, nondistended without guarding or rebound. Extremities: Without cyanosis, clubbing, 2+ pitting edema bilateral lower extremities distal to the knees Skin:  Warm and dry, no erythema.  Chronic skin changes  bilateral lower extremity consistent with chronic edema  Data Reviewed: I have personally reviewed following labs and imaging studies  CBC: Recent Labs  Lab 09/22/22 2206 09/23/22 1119 09/24/22 0124 09/26/22 0926 09/28/22 0616  WBC 5.8 5.0 4.1 5.7 4.8  HGB 12.0 11.6* 10.8* 12.1 10.7*  HCT 37.2 35.7* 33.9* 37.2 33.0*  MCV 90.7 90.6 88.7 89.9 89.9  PLT 159 141* 133* 129* 115*   Basic Metabolic Panel: Recent Labs  Lab 09/24/22 0124 09/25/22 0830 09/26/22 0926 09/27/22 1109 09/28/22 0616 09/29/22 0220  NA 132* 132* 129* 127* 127* 129*  K 3.8 3.9 4.0 4.5 4.6 4.6  CL 100 103 99 96* 100 98  CO2 23 20* 22 19* 17* 19*  GLUCOSE 126* 115* 145* 105* 89 94  BUN 26* 26* 24* 23 23 25*  CREATININE 4.70* 5.55* 5.44* 5.41* 5.45* 5.55*  CALCIUM 7.7* 7.9* 8.5* 8.3* 8.2* 8.2*  MG 1.2*  --   --   --   --   --    GFR: Estimated Creatinine Clearance: 8.5 mL/min (A) (by C-G formula based on SCr of 5.55 mg/dL (H)).  Liver Function Tests: Recent Labs  Lab 09/22/22 2206  AST 19  ALT 11  ALKPHOS 53  BILITOT 0.7  PROT 6.6  ALBUMIN 3.4*   Recent Labs  Lab 09/22/22 2206  LIPASE 30    No results found for this or any previous visit (from the past 240 hour(s)).   Radiology Studies: US RENAL  Result Date: 09/28/2022 CLINICAL DATA:  Acute kidney failure EXAM: RENAL / URINARY TRACT ULTRASOUND COMPLETE COMPARISON:  CT 09/23/2022 and older FINDINGS: Right Kidney: Renal measurements: 10.1 x 5.1 x 4.3 cm = volume: 116 mL. Echogenicity within normal limits. No mass or hydronephrosis visualized. There is a parapelvic cyst identified on the right measuring 13 mm. Left Kidney: Renal measurements: 8.7 x 4.7 x 4.1 cm = volume: 88.1 mL. No collecting system dilatation. No perinephric fluid. Simple appearing cysts identified in the midportion measuring 2.4 cm. Bladder: Underdistended.  Preserved contour. Other: Limited by overlapping bowel gas and soft tissue IMPRESSION: No collecting system dilatation.  Bilateral simple appearing renal cysts. No specific imaging follow-up. Please correlate with prior CT examinations. Electronically Signed   By: Karen Kays M.D.   On: 09/28/2022 15:19    Scheduled Meds:  heparin  5,000 Units Subcutaneous Q8H   metoprolol tartrate  12.5 mg Oral TID   pantoprazole  40 mg Oral Daily   Continuous Infusions:  cefTRIAXone (ROCEPHIN)  IV 2 g (09/28/22 1322)   metronidazole 500 mg (09/28/22 2307)     LOS: 6 days   Time spent:  Azucena Fallen, DO [facility]  If 7PM-7AM, please contact night-coverage [email or web address]  09/29/2022, 7:52 AM

## 2022-09-29 NOTE — Progress Notes (Signed)
   09/29/22 1439  Mobility  Activity Ambulated with assistance in hallway  Level of Assistance Modified independent, requires aide device or extra time  Assistive Device Front wheel walker  Distance Ambulated (ft) 250 ft  Activity Response Tolerated well  Mobility Referral Yes  $Mobility charge 1 Mobility  Mobility Specialist Start Time (ACUTE ONLY) 1417  Mobility Specialist Stop Time (ACUTE ONLY) 1437  Mobility Specialist Time Calculation (min) (ACUTE ONLY) 20 min   Mobility Specialist: Progress Note Pt agreeable to mobility session - received in chair. During ambulation, pt took 5 standing rest breaks ( <10-15sec), c/o hip discomfort, pain in arms, and "everywhere." Pt returned to chair with all needs met, call bell within reach. Family present.   Barnie Mort, BS Mobility Specialist Please contact via SecureChat or Rehab office at [phone]

## 2022-09-29 NOTE — Progress Notes (Signed)
[facility] Progress Note   88 y.o. female  tachybrady syndrome with [employer]. Jude pacemaker (07/2014), OA, PAD, HTN, urinary incontinence initially presented to [facility] for renal failure. She had presented prior to that to [facility] with abdominal pain diagnosed with sigmoid diverticulitis and treated with antibiotics (cipro + flagyl). IV contrast ( of Iohexol) on 8/2 and next labs were on 8/6 when Cr. was 3.5.   Urinalysis showed ketones with no proteinuria and no Hb, LE or nitrites.   Assessment/ Plan:   Renal failure: may be from contrast induced nephropathy; she was possibly prerenal at that time with abdominal pain and poor oral input. No obstruction on CT on 8/7 with only a small nephrolith noted on the lower side.  - For now supportive care and hopefully we don't need to make the decision about dialysis in the future. Renal ultrasound -> no hydronephrosis and c/w neg CT at admission when she had renal failure. - Continue holding the IVF for now; encouraged her to increased PO intake; daughter was bedside. UOP starting to pick up and hopefuly that is a precursor to renal recovery. Cr may be starting to plateau.  - Will follow closely with you.   -Monitor Daily I/Os, Need daily weights  -Maintain MAP>65 for optimal renal perfusion.  - Avoid nephrotoxic agents such as IV contrast, NSAIDs, and phosphate containing bowel preps (FLEETS)   Hyponatremia: likely secondary to renal failure and poor solute intake + free water intake primarily +  component of SIADH from nausea and pain. - Fluid restrict to 1L per day and will follow closely with you. - TSH slightly elevated; cortisol no result and need to send urine studies. - Improving likely bec of fluid restriction and stopping the IVF (SIADH). Tachybrady syndrome with PM. Diverticultities: on Rocephin and Flagyl.  Subjective:   Still not feeling great; legs feel heavy but denies f/c/v. Able to ambulate to the  restroom.    Objective:   BP 104/70 (BP Location: Right Arm)   Pulse (!) 119   Temp 97.6 F (36.4 C) (Oral)   Resp 18   Ht 5\' 8"  (1.727 m)   Wt 89.1 kg   SpO2 99%   BMI 29.87 kg/m   Intake/Output Summary (Last 24 hours) at 09/29/2022 1128 Last data filed at 09/29/2022 0102 Gross per 24 hour  Intake 465 ml  Output 1050 ml  Net -585 ml   Weight change:   Physical Exam: General appearance: alert, cooperative Head: NCAT Neck: no JVD Back: No CVA tenderness. Resp: CTA b/l Cardio: regular rate and rhythm GI: SNDNT+BS Extremities: edema 1+ Pulses: 2+ and symmetric GU: no foley  Imaging: US RENAL  Result Date: 09/28/2022 CLINICAL DATA:  Acute kidney failure EXAM: RENAL / URINARY TRACT ULTRASOUND COMPLETE COMPARISON:  CT 09/23/2022 and older FINDINGS: Right Kidney: Renal measurements: 10.1 x 5.1 x 4.3 cm = volume: 116 mL. Echogenicity within normal limits. No mass or hydronephrosis visualized. There is a parapelvic cyst identified on the right measuring 13 mm. Left Kidney: Renal measurements: 8.7 x 4.7 x 4.1 cm = volume: 88.1 mL. No collecting system dilatation. No perinephric fluid. Simple appearing cysts identified in the midportion measuring 2.4 cm. Bladder: Underdistended.  Preserved contour. Other: Limited by overlapping bowel gas and soft tissue IMPRESSION: No collecting system dilatation. Bilateral simple appearing renal cysts. No specific imaging follow-up. Please correlate with prior CT examinations. Electronically Signed   By: Karen Kays M.D.   On: 09/28/2022 15:19  Labs: BMET Recent Labs  Lab 09/22/22 2206 09/23/22 1119 09/24/22 0124 09/25/22 0830 09/26/22 0926 09/27/22 1109 09/28/22 0616 09/29/22 0220  NA 135  --  132* 132* 129* 127* 127* 129*  K 3.8  --  3.8 3.9 4.0 4.5 4.6 4.6  CL 99  --  100 103 99 96* 100 98  CO2 26  --  23 20* 22 19* 17* 19*  GLUCOSE 152*  --  126* 115* 145* 105* 89 94  BUN 28*  --  26* 26* 24* 23 23 25*  CREATININE 3.50* 4.20*  4.70* 5.55* 5.44* 5.41* 5.45* 5.55*  CALCIUM 8.9  --  7.7* 7.9* 8.5* 8.3* 8.2* 8.2*   CBC Recent Labs  Lab 09/23/22 1119 09/24/22 0124 09/26/22 0926 09/28/22 0616  WBC 5.0 4.1 5.7 4.8  HGB 11.6* 10.8* 12.1 10.7*  HCT 35.7* 33.9* 37.2 33.0*  MCV 90.6 88.7 89.9 89.9  PLT 141* 133* 129* 115*    Medications:     heparin  5,000 Units Subcutaneous Q8H   metoprolol tartrate  12.5 mg Oral NOW   metoprolol tartrate  25 mg Oral TID   pantoprazole  40 mg Oral Daily      Paulene Floor, MD 09/29/2022, 11:28 AM

## 2022-09-30 DIAGNOSIS — N179 Acute kidney failure, unspecified: Secondary | ICD-10-CM | POA: Diagnosis not present

## 2022-09-30 NOTE — Progress Notes (Signed)
   09/30/22 1158  Mobility  Activity Ambulated with assistance in hallway  Level of Assistance Modified independent, requires aide device or extra time  Assistive Device Front wheel walker  Distance Ambulated (ft) 250 ft  Activity Response Tolerated well  Mobility Referral Yes  $Mobility charge 1 Mobility  Mobility Specialist Start Time (ACUTE ONLY) 1030  Mobility Specialist Stop Time (ACUTE ONLY) 1045  Mobility Specialist Time Calculation (min) (ACUTE ONLY) 15 min   Mobility Specialist: Progress Note  Pt agreeable to mobility session- received in chair. Pt c/o left hip and knee pain, needed multiple breaks throughout. After session, pt returned to chair with all needs met - call bell within reach.   Barnie Mort Mobility Specialist Please contact via SecureChat or Rehab office at [phone]

## 2022-09-30 NOTE — Progress Notes (Signed)
[facility] PROGRESS NOTE    Progress Note  Dawn Henderson  KGM:[id number] DOB: [birth year] DOA: 09/23/2022 PCP: Lorelei Pont, DO     Brief Narrative:   Dawn Henderson is an 88 y.o. female past medical history significant for tachybradycardia syndrome status post pacemaker, osteoarthritis, essential hypertension transferred from [facility] for acute kidney injury and ongoing diverticulitis question failure antibiotic treatment Cipro Flagyl at the outpatient setting.    Assessment/Plan:   AKI (acute kidney injury) (HCC) With a baseline creatinine around 1.0, now has stabilized around 5.5 Urine output has remained appropriate, was actually improving. Nephrology was consulted renal ultrasound showed bilateral renal cyst. Renal recommended to continue to hold IV fluids, continue oral intake, as urine output has started to pick up she may have renal recovery. Hopefully she starting plateau.  Acute sigmoid diverticulitis uncomplicated: Which failed Cipro and Flagyl. She was changed to IV Rocephin and Flagyl, CT scan of the abdomen pelvis showed no abscess. She is tolerating her diet seems to be improving. Will continue 10-day course.  Hypomagnesemia: Replete orally now resolved.  History of tachybradycardia syndrome: follow-up with cardiology as an outpatient, continue Toprol.  Hiatal hernia:  continue PPI.   DVT prophylaxis: lovenox Family Communication:none Status is: Inpatient Remains inpatient appropriate because: Acute kidney injury with acute  sigmoid diverticulitis    Code Status:     Code Status Orders  (From admission, onward)           Start     Ordered   09/23/22 1100  Do not attempt resuscitation (DNR)  Continuous       Question Answer Comment  If patient has no pulse and is not breathing Do Not Attempt Resuscitation   If patient has a pulse and/or is breathing: Medical Treatment Goals LIMITED ADDITIONAL INTERVENTIONS: Use medication/IV fluids  and cardiac monitoring as indicated; Do not use intubation or mechanical ventilation (DNI), also provide comfort medications.  Transfer to Progressive/Stepdown as indicated, avoid Intensive Care.   Consent: Discussion documented in EHR or advanced directives reviewed      09/23/22 1100           Code Status History     Date Active Date Inactive Code Status Order ID Comments User Context   08/14/2014 1635 08/16/2014 2046 Full Code [id number]  Lanney Gins, PA-C Inpatient         IV Access:   Peripheral IV   Procedures and diagnostic studies:   US RENAL  Result Date: 09/28/2022 CLINICAL DATA:  Acute kidney failure EXAM: RENAL / URINARY TRACT ULTRASOUND COMPLETE COMPARISON:  CT 09/23/2022 and older FINDINGS: Right Kidney: Renal measurements: 10.1 x 5.1 x 4.3 cm = volume: 116 mL. Echogenicity within normal limits. No mass or hydronephrosis visualized. There is a parapelvic cyst identified on the right measuring 13 mm. Left Kidney: Renal measurements: 8.7 x 4.7 x 4.1 cm = volume: 88.1 mL. No collecting system dilatation. No perinephric fluid. Simple appearing cysts identified in the midportion measuring 2.4 cm. Bladder: Underdistended.  Preserved contour. Other: Limited by overlapping bowel gas and soft tissue IMPRESSION: No collecting system dilatation. Bilateral simple appearing renal cysts. No specific imaging follow-up. Please correlate with prior CT examinations. Electronically Signed   By: Karen Kays M.D.   On: 09/28/2022 15:19     Medical Consultants:   None.   Subjective:    Dawn Henderson feels better tolerating her diet.  Objective:    Vitals:   09/29/22 2158 09/30/22 0500 09/30/22 0507 09/30/22 0827  BP:  129/77 (!) 146/86  Pulse: 67  68 78  Resp:   18 17  Temp:   97.8 F (36.6 C) 97.6 F (36.4 C)  TempSrc:   Oral Oral  SpO2:   99% 98%  Weight:  89.1 kg    Height:       SpO2: 98 %   Intake/Output Summary (Last 24 hours) at 09/30/2022 0942 Last data  filed at 09/30/2022 0506 Gross per 24 hour  Intake 2220 ml  Output 500 ml  Net 1720 ml   Filed Weights   09/29/22 0400 09/30/22 0500  Weight: 89.1 kg 89.1 kg    Exam: General exam: In no acute distress. Respiratory system: Good air movement and clear to auscultation. Cardiovascular system: S1 & S2 heard, RRR. No JVD.  Gastrointestinal system: Abdomen is nondistended, soft and nontender.  Extremities: No pedal edema. Skin: No rashes, lesions or ulcers Psychiatry: Judgement and insight appear normal. Mood & affect appropriate.    Data Reviewed:    Labs: Basic Metabolic Panel: Recent Labs  Lab 09/24/22 0124 09/25/22 0830 09/26/22 0926 09/27/22 1109 09/28/22 0616 09/29/22 0220 09/30/22 0634  NA 132*   < > 129* 127* 127* 129* 131*  K 3.8   < > 4.0 4.5 4.6 4.6 4.9  CL 100   < > 99 96* 100 98 100  CO2 23   < > 22 19* 17* 19* 19*  GLUCOSE 126*   < > 145* 105* 89 94 92  BUN 26*   < > 24* 23 23 25* 28*  CREATININE 4.70*   < > 5.44* 5.41* 5.45* 5.55* 5.68*  CALCIUM 7.7*   < > 8.5* 8.3* 8.2* 8.2* 8.1*  MG 1.2*  --   --   --   --   --   --    < > = values in this interval not displayed.   GFR Estimated Creatinine Clearance: 8.3 mL/min (A) (by C-G formula based on SCr of 5.68 mg/dL (H)). Liver Function Tests: No results for input(s): "AST", "ALT", "ALKPHOS", "BILITOT", "PROT", "ALBUMIN" in the last 168 hours. No results for input(s): "LIPASE", "AMYLASE" in the last 168 hours. No results for input(s): "AMMONIA" in the last 168 hours. Coagulation profile No results for input(s): "INR", "PROTIME" in the last 168 hours. COVID-19 Labs  No results for input(s): "DDIMER", "FERRITIN", "LDH", "CRP" in the last 72 hours.  No results found for: "SARSCOV2NAA"  CBC: Recent Labs  Lab 09/23/22 1119 09/24/22 0124 09/26/22 0926 09/28/22 0616 09/30/22 0634  WBC 5.0 4.1 5.7 4.8 4.2  HGB 11.6* 10.8* 12.1 10.7* 9.5*  HCT 35.7* 33.9* 37.2 33.0* 30.0*  MCV 90.6 88.7 89.9 89.9 88.8   PLT 141* 133* 129* 115* 130*   Cardiac Enzymes: No results for input(s): "CKTOTAL", "CKMB", "CKMBINDEX", "TROPONINI" in the last 168 hours. BNP (last 3 results) No results for input(s): "PROBNP" in the last 8760 hours. CBG: No results for input(s): "GLUCAP" in the last 168 hours. D-Dimer: No results for input(s): "DDIMER" in the last 72 hours. Hgb A1c: No results for input(s): "HGBA1C" in the last 72 hours. Lipid Profile: No results for input(s): "CHOL", "HDL", "LDLCALC", "TRIG", "CHOLHDL", "LDLDIRECT" in the last 72 hours. Thyroid function studies: Recent Labs    09/28/22 0613  TSH 4.718*   Anemia work up: No results for input(s): "VITAMINB12", "FOLATE", "FERRITIN", "TIBC", "IRON", "RETICCTPCT" in the last 72 hours. Sepsis Labs: Recent Labs  Lab 09/24/22 0124 09/26/22 0926 09/28/22 0616 09/30/22 0634  WBC 4.1 5.7  4.8 4.2   Microbiology No results found for this or any previous visit (from the past 240 hour(s)).   Medications:    heparin  5,000 Units Subcutaneous Q8H   metoprolol tartrate  12.5 mg Oral NOW   metoprolol tartrate  25 mg Oral TID   pantoprazole  40 mg Oral Daily   Continuous Infusions:  cefTRIAXone (ROCEPHIN)  IV Stopped (09/29/22 1517)   metronidazole 500 mg (09/29/22 2339)      LOS: 7 days   Marinda Elk  [facility]  09/30/2022, 9:42 AM

## 2022-09-30 NOTE — Progress Notes (Signed)
[facility] Progress Note   88 y.o. female  tachybrady syndrome with St. Jude pacemaker (07/2014), OA, PAD, HTN, urinary incontinence initially presented to [facility] for renal failure. She had presented prior to that to [facility] with abdominal pain diagnosed with sigmoid diverticulitis and treated with antibiotics (cipro + flagyl). IV contrast ( of Iohexol) on 8/2 and next labs were on 8/6 when Cr. was 3.5.   Urinalysis showed ketones with no proteinuria and no Hb, LE or nitrites.   Assessment/ Plan:   Renal failure: may be from contrast induced nephropathy; she was possibly prerenal at that time with abdominal pain and poor oral input. No obstruction on CT on 8/7 with only a small nephrolith noted on the lower side.  - For now supportive care and hopefully we don't need to make the decision about dialysis in the future. Renal ultrasound -> no hydronephrosis and c/w neg CT at admission when she had renal failure. - Continue holding the IVF for now; encouraged her to increased PO intake; daughter was bedside. UOP looks like it's less but d/w daughter and pt; she feels she's actually voiding much more. She uses a Depends/ diaper which has been very heavy. Fortunately no absolute indication for HD; she's has had dysgeusia since COVID a few years ago and doesn't feel there's anything different.   - Will continue to follow closely with you.   -Monitor Daily I/Os, Need daily weights  -Maintain MAP>65 for optimal renal perfusion.  - Avoid nephrotoxic agents such as IV contrast, NSAIDs, and phosphate containing bowel preps (FLEETS)   Hyponatremia: likely secondary to renal failure and poor solute intake + free water intake primarily +  component of SIADH from nausea and pain. - Fluid restrict to 1L per day -> improving. - TSH slightly elevated; cortisol no result. - Improving likely bec of fluid restriction and stopping the IVF (SIADH). Tachybrady syndrome with  PM. Diverticultities: on Rocephin and Flagyl.  Subjective:   Slightly improved but  legs feel heavy. Denies f/c/v.  Able to ambulate to the restroom; daughter bedside and updated.    Objective:   BP (!) 146/86 (BP Location: Right Arm)   Pulse 78   Temp 97.6 F (36.4 C) (Oral)   Resp 17   Ht 5\' 8"  (1.727 m)   Wt 89.1 kg   SpO2 98%   BMI 29.87 kg/m   Intake/Output Summary (Last 24 hours) at 09/30/2022 1241 Last data filed at 09/30/2022 0730 Gross per 24 hour  Intake 2340 ml  Output 500 ml  Net 1840 ml   Weight change: 0 kg  Physical Exam: General appearance: alert, cooperative Head: NCAT Neck: no JVD Back: No CVA tenderness. Resp: CTA b/l Cardio: regular rate and rhythm GI: SNDNT+BS Extremities: edema 1+ Pulses: 2+ and symmetric GU: no foley  Imaging: US RENAL  Result Date: 09/28/2022 CLINICAL DATA:  Acute kidney failure EXAM: RENAL / URINARY TRACT ULTRASOUND COMPLETE COMPARISON:  CT 09/23/2022 and older FINDINGS: Right Kidney: Renal measurements: 10.1 x 5.1 x 4.3 cm = volume: 116 mL. Echogenicity within normal limits. No mass or hydronephrosis visualized. There is a parapelvic cyst identified on the right measuring 13 mm. Left Kidney: Renal measurements: 8.7 x 4.7 x 4.1 cm = volume: 88.1 mL. No collecting system dilatation. No perinephric fluid. Simple appearing cysts identified in the midportion measuring 2.4 cm. Bladder: Underdistended.  Preserved contour. Other: Limited by overlapping bowel gas and soft tissue IMPRESSION: No collecting system dilatation. Bilateral simple appearing renal  cysts. No specific imaging follow-up. Please correlate with prior CT examinations. Electronically Signed   By: Karen Kays M.D.   On: 09/28/2022 15:19    Labs: BMET Recent Labs  Lab 09/24/22 0124 09/25/22 0830 09/26/22 0926 09/27/22 1109 09/28/22 0616 09/29/22 0220 09/30/22 0634  NA 132* 132* 129* 127* 127* 129* 131*  K 3.8 3.9 4.0 4.5 4.6 4.6 4.9  CL 100 103 99 96* 100 98  100  CO2 23 20* 22 19* 17* 19* 19*  GLUCOSE 126* 115* 145* 105* 89 94 92  BUN 26* 26* 24* 23 23 25* 28*  CREATININE 4.70* 5.55* 5.44* 5.41* 5.45* 5.55* 5.68*  CALCIUM 7.7* 7.9* 8.5* 8.3* 8.2* 8.2* 8.1*   CBC Recent Labs  Lab 09/24/22 0124 09/26/22 0926 09/28/22 0616 09/30/22 0634  WBC 4.1 5.7 4.8 4.2  HGB 10.8* 12.1 10.7* 9.5*  HCT 33.9* 37.2 33.0* 30.0*  MCV 88.7 89.9 89.9 88.8  PLT 133* 129* 115* 130*    Medications:     heparin  5,000 Units Subcutaneous Q8H   metoprolol tartrate  25 mg Oral TID   pantoprazole  40 mg Oral Daily      Paulene Floor, MD 09/30/2022, 12:41 PM

## 2022-09-30 NOTE — Plan of Care (Signed)
Patient alert and oriented. No acute distress noted, no complaints voiced. Niece remains at bedside. All needs attended to. Will continue to monitor.  Problem: Education: Goal: Knowledge of General Education information will improve Description: Including pain rating scale, medication(s)/side effects and non-pharmacologic comfort measures Outcome: Progressing   Problem: Health Behavior/Discharge Planning: Goal: Ability to manage health-related needs will improve Outcome: Progressing   Problem: Clinical Measurements: Goal: Ability to maintain clinical measurements within normal limits will improve Outcome: Progressing Goal: Will remain free from infection Outcome: Progressing Goal: Diagnostic test results will improve Outcome: Progressing Goal: Respiratory complications will improve Outcome: Progressing Goal: Cardiovascular complication will be avoided Outcome: Progressing   Problem: Activity: Goal: Risk for activity intolerance will decrease Outcome: Progressing   Problem: Nutrition: Goal: Adequate nutrition will be maintained Outcome: Progressing   Problem: Coping: Goal: Level of anxiety will decrease Outcome: Progressing   Problem: Elimination: Goal: Will not experience complications related to bowel motility Outcome: Progressing Goal: Will not experience complications related to urinary retention Outcome: Progressing   Problem: Pain Managment: Goal: General experience of comfort will improve Outcome: Progressing   Problem: Safety: Goal: Ability to remain free from injury will improve Outcome: Progressing   Problem: Skin Integrity: Goal: Risk for impaired skin integrity will decrease Outcome: Progressing

## 2022-09-30 NOTE — Plan of Care (Signed)
  Problem: Activity: Goal: Risk for activity intolerance will decrease Outcome: Progressing   Problem: Elimination: Goal: Will not experience complications related to urinary retention Outcome: Progressing   

## 2022-10-01 DIAGNOSIS — N179 Acute kidney failure, unspecified: Secondary | ICD-10-CM | POA: Diagnosis not present

## 2022-10-01 LAB — BASIC METABOLIC PANEL
Anion gap: 8 (ref 5–15)
BUN: 28 mg/dL — ABNORMAL HIGH (ref 8–23)
CO2: 21 mmol/L — ABNORMAL LOW (ref 22–32)
Calcium: 8.3 mg/dL — ABNORMAL LOW (ref 8.9–10.3)
Chloride: 104 mmol/L (ref 98–111)
Creatinine, Ser: 5.59 mg/dL — ABNORMAL HIGH (ref 0.44–1.00)
GFR, Estimated: 7 mL/min — ABNORMAL LOW (ref >60)
Glucose, Bld: 95 mg/dL (ref 70–99)
Potassium: 4.8 mmol/L (ref 3.5–5.1)
Sodium: 133 mmol/L — ABNORMAL LOW (ref 135–145)

## 2022-10-01 MED ORDER — TRAMADOL HCL 50 MG PO TABS
25.0000 mg | ORAL_TABLET | Freq: Four times a day (QID) | ORAL | Status: DC | PRN
  Administered 2022-10-01 – 2022-10-02 (×2): 25 mg via ORAL
  Filled 2022-10-01 (×2): qty 1

## 2022-10-01 NOTE — Progress Notes (Signed)
[facility] PROGRESS NOTE    Progress Note  Lauralee Purpura  ZOX:[id number] DOB: 1935-01-12 DOA: 09/23/2022 PCP: Lorelei Pont, DO     Brief Narrative:   Dawn Henderson is an 88 y.o. female past medical history significant for tachybradycardia syndrome status post pacemaker, osteoarthritis, essential hypertension transferred from [facility] for acute kidney injury and ongoing diverticulitis question failure antibiotic treatment Cipro Flagyl at the outpatient setting.    Assessment/Plan:   AKI (acute kidney injury) (HCC) With a baseline creatinine around 1.0, now has stabilized around 5.5 Urine output has remained appropriate, was actually improving. Nephrology was consulted renal ultrasound showed bilateral renal cyst. Renal recommended to continue to hold IV fluids, continue oral intake, as urine output has started to pick up she may have renal recovery. Hopefully she starting plateau.  Acute sigmoid diverticulitis uncomplicated: Which failed Cipro and Flagyl. She was changed to IV Rocephin and Flagyl, CT scan of the abdomen pelvis showed no abscess. She is tolerating her diet seems to be improving. Will continue 10-day course *Last dose 10/02/22  Hypomagnesemia: Replete orally now resolved.  History of tachybradycardia syndrome: follow-up with cardiology as an outpatient, continue Toprol.  Dependent BLE edema -Chronic - improved with elevated legs and compression stockings -Likely peripheral venous insufficiency given no central findings of volume overload  Hiatal hernia:  continue PPI, asymptomatic   DVT prophylaxis: lovenox   Code Status: DNR Family Communication:none Status is: Inpatient Remains inpatient appropriate because: Acute kidney injury with acute  sigmoid diverticulitis  IV Access:   Peripheral IV  Medical Consultants:   None.  Subjective:    [employer] feels better tolerating her diet.  Objective:    Vitals:   09/30/22 0507  09/30/22 0827 09/30/22 1343 10/01/22 0307  BP: 129/77 (!) 146/86 (!) 145/71 (!) 154/71  Pulse: 68 78 64 66  Resp: 18 17 16 16   Temp: 97.8 F (36.6 C) 97.6 F (36.4 C) 97.9 F (36.6 C) 98 F (36.7 C)  TempSrc: Oral Oral Oral   SpO2: 99% 98% 100% 95%  Weight:      Height:       SpO2: 95 %   Intake/Output Summary (Last 24 hours) at 10/01/2022 0637 Last data filed at 10/01/2022 0618 Gross per 24 hour  Intake 120 ml  Output 2250 ml  Net -2130 ml   Filed Weights   09/29/22 0400 09/30/22 0500  Weight: 89.1 kg 89.1 kg    Exam: General exam: In no acute distress. Respiratory system: Good air movement and clear to auscultation. Cardiovascular system: S1 & S2 heard, RRR. No JVD.  Gastrointestinal system: Abdomen is nondistended, soft and nontender.  Extremities: No pedal edema. Skin: No rashes, lesions or ulcers Psychiatry: Judgement and insight appear normal. Mood & affect appropriate.    Data Reviewed:    Labs: Basic Metabolic Panel: Recent Labs  Lab 09/27/22 1109 09/28/22 0616 09/29/22 0220 09/30/22 0634 10/01/22 0253  NA 127* 127* 129* 131* 133*  K 4.5 4.6 4.6 4.9 4.8  CL 96* 100 98 100 104  CO2 19* 17* 19* 19* 21*  GLUCOSE 105* 89 94 92 95  BUN 23 23 25* 28* 28*  CREATININE 5.41* 5.45* 5.55* 5.68* 5.59*  CALCIUM 8.3* 8.2* 8.2* 8.1* 8.3*   GFR Estimated Creatinine Clearance: 8.4 mL/min (A) (by C-G formula based on SCr of 5.59 mg/dL (H)).  CBC: Recent Labs  Lab 09/26/22 0926 09/28/22 0616 09/30/22 0634  WBC 5.7 4.8 4.2  HGB 12.1 10.7* 9.5*  HCT 37.2  33.0* 30.0*  MCV 89.9 89.9 88.8  PLT 129* 115* 130*   Sepsis Labs: Recent Labs  Lab 09/26/22 0926 09/28/22 0616 09/30/22 0634  WBC 5.7 4.8 4.2   Microbiology No results found for this or any previous visit (from the past 240 hour(s)).   Medications:    heparin  5,000 Units Subcutaneous Q8H   metoprolol tartrate  25 mg Oral TID   pantoprazole  40 mg Oral Daily   Continuous Infusions:   cefTRIAXone (ROCEPHIN)  IV 2 g (09/30/22 1251)   metronidazole 500 mg (09/30/22 2308)    LOS: 8 days   Azucena Fallen  [facility]  10/01/2022, 6:37 AM

## 2022-10-01 NOTE — Progress Notes (Signed)
[facility] Progress Note   88 y.o. female  tachybrady syndrome with St. Jude pacemaker (07/2014), OA, PAD, HTN, urinary incontinence initially presented to [facility] for renal failure. She had presented prior to that to [facility] with abdominal pain diagnosed with sigmoid diverticulitis and treated with antibiotics (cipro + flagyl). IV contrast ( of Iohexol) on 8/2 and next labs were on 8/6 when Cr. was 3.5.   Urinalysis showed ketones with no proteinuria and no Hb, LE or nitrites.   Assessment/ Plan:   Renal failure: may be from contrast induced nephropathy; she was possibly prerenal at that time with abdominal pain and poor oral input. No obstruction on CT on 8/7 with only a small nephrolith noted on the lower side. She's had dysgeusia since COVID a few years ago and doesn't feel there's anything different.  - For now supportive care and hopefully we don't need to make the decision about dialysis in the future. Renal ultrasound -> no hydronephrosis and c/w neg CT at admission when she had renal failure. - Continue holding the IVF for now; encouraged her to increased PO intake; daughter not bedside this am. UOP 2.5L over 24hrs. She uses a Depends/ diaper which has been very heavy.   Hopefully she's in the polyuric phase of ATN and will start recovery phase soon. Renal function fairly stable.  - Will continue to follow closely with you.   -Monitor Daily I/Os, Need daily weights  -Maintain MAP>65 for optimal renal perfusion.  - Avoid nephrotoxic agents such as IV contrast, NSAIDs, and phosphate containing bowel preps (FLEETS)   Hyponatremia: likely secondary to renal failure and poor solute intake + free water intake primarily +  component of SIADH from nausea and pain. - Fluid restrict to 1L per day -> improving. - TSH slightly elevated; cortisol no result. - Improving likely bec of fluid restriction and stopping the IVF (SIADH). Tachybrady syndrome with  PM. Diverticultities: on Rocephin and Flagyl.  Subjective:   Slightly improved but  legs feel heavy. Denies f/c/v.  Able to ambulate to the restroom; daughter not bedside this. I updated the patient.    Objective:   BP (!) 148/65 (BP Location: Right Arm)   Pulse 71   Temp 97.9 F (36.6 C) (Oral)   Resp 18   Ht 5\' 8"  (1.727 m)   Wt 89.1 kg   SpO2 95%   BMI 29.87 kg/m   Intake/Output Summary (Last 24 hours) at 10/01/2022 0944 Last data filed at 10/01/2022 1610 Gross per 24 hour  Intake --  Output 2250 ml  Net -2250 ml   Weight change:   Physical Exam: General appearance: alert, cooperative Head: NCAT Neck: no JVD Back: No CVA tenderness. Resp: CTA b/l Cardio: regular rate and rhythm GI: SNDNT+BS Extremities: edema 1+ Pulses: 2+ and symmetric GU: no foley  Imaging: No results found.  Labs: BMET Recent Labs  Lab 09/25/22 0830 09/26/22 0926 09/27/22 1109 09/28/22 0616 09/29/22 0220 09/30/22 0634 10/01/22 0253  NA 132* 129* 127* 127* 129* 131* 133*  K 3.9 4.0 4.5 4.6 4.6 4.9 4.8  CL 103 99 96* 100 98 100 104  CO2 20* 22 19* 17* 19* 19* 21*  GLUCOSE 115* 145* 105* 89 94 92 95  BUN 26* 24* 23 23 25* 28* 28*  CREATININE 5.55* 5.44* 5.41* 5.45* 5.55* 5.68* 5.59*  CALCIUM 7.9* 8.5* 8.3* 8.2* 8.2* 8.1* 8.3*   CBC Recent Labs  Lab 09/26/22 0926 09/28/22 0616 09/30/22 0634  WBC 5.7  4.8 4.2  HGB 12.1 10.7* 9.5*  HCT 37.2 33.0* 30.0*  MCV 89.9 89.9 88.8  PLT 129* 115* 130*    Medications:     heparin  5,000 Units Subcutaneous Q8H   metoprolol tartrate  25 mg Oral TID   pantoprazole  40 mg Oral Daily      Paulene Floor, MD 10/01/2022, 9:44 AM

## 2022-10-01 NOTE — Plan of Care (Signed)

## 2022-10-01 NOTE — Plan of Care (Signed)
  Problem: Activity: Goal: Risk for activity intolerance will decrease Outcome: Progressing   Problem: Elimination: Goal: Will not experience complications related to urinary retention Outcome: Progressing   Problem: Pain Managment: Goal: General experience of comfort will improve Outcome: Progressing   Problem: Nutrition: Goal: Adequate nutrition will be maintained Outcome: Not Progressing

## 2022-10-01 NOTE — Progress Notes (Signed)
   10/01/22 1444  Mobility  Activity Ambulated with assistance in hallway  Level of Assistance Modified independent, requires aide device or extra time  Assistive Device Front wheel walker  Distance Ambulated (ft) 250 ft  Activity Response Tolerated well  Mobility Referral Yes  $Mobility charge 1 Mobility  Mobility Specialist Start Time (ACUTE ONLY) 0212  Mobility Specialist Stop Time (ACUTE ONLY) 0234  Mobility Specialist Time Calculation (min) (ACUTE ONLY) 22 min   Mobility Specialist: Progress Note  Pt received in chair - agreeable to mobility session. Pt c/o L. hip tightness and pain. Pt took two standing breaks, SOB upon returning to room. Pt seated in recliner with pursed lip cues given to regulate breathing. Pt has all needs met - call bell within reach.   Barnie Mort Mobility Specialist Please contact via SecureChat or Rehab office at [phone]

## 2022-10-02 DIAGNOSIS — D696 Thrombocytopenia, unspecified: Secondary | ICD-10-CM | POA: Diagnosis present

## 2022-10-02 DIAGNOSIS — N179 Acute kidney failure, unspecified: Secondary | ICD-10-CM | POA: Diagnosis not present

## 2022-10-02 DIAGNOSIS — I10 Essential (primary) hypertension: Secondary | ICD-10-CM | POA: Diagnosis present

## 2022-10-02 DIAGNOSIS — D649 Anemia, unspecified: Secondary | ICD-10-CM | POA: Diagnosis present

## 2022-10-02 LAB — CBC
HCT: 31.6 % — ABNORMAL LOW (ref 36.0–46.0)
Hemoglobin: 10 g/dL — ABNORMAL LOW (ref 12.0–15.0)
MCH: 28.6 pg (ref 26.0–34.0)
MCHC: 31.6 g/dL (ref 30.0–36.0)
MCV: 90.3 fL (ref 80.0–100.0)
Platelets: 141 10*3/uL — ABNORMAL LOW (ref 150–400)
RBC: 3.5 MIL/uL — ABNORMAL LOW (ref 3.87–5.11)
RDW: 16 % — ABNORMAL HIGH (ref 11.5–15.5)
WBC: 4.8 10*3/uL (ref 4.0–10.5)
nRBC: 0 % (ref 0.0–0.2)

## 2022-10-02 LAB — BASIC METABOLIC PANEL
Anion gap: 9 (ref 5–15)
BUN: 29 mg/dL — ABNORMAL HIGH (ref 8–23)
CO2: 23 mmol/L (ref 22–32)
Calcium: 8.4 mg/dL — ABNORMAL LOW (ref 8.9–10.3)
Chloride: 104 mmol/L (ref 98–111)
Creatinine, Ser: 5.13 mg/dL — ABNORMAL HIGH (ref 0.44–1.00)
GFR, Estimated: 8 mL/min — ABNORMAL LOW (ref >60)
Glucose, Bld: 96 mg/dL (ref 70–99)
Potassium: 4.9 mmol/L (ref 3.5–5.1)
Sodium: 136 mmol/L (ref 135–145)

## 2022-10-02 MED ORDER — OXYCODONE HCL 5 MG PO TABS
2.5000 mg | ORAL_TABLET | ORAL | Status: DC | PRN
  Administered 2022-10-03 – 2022-10-06 (×4): 2.5 mg via ORAL
  Filled 2022-10-02 (×5): qty 1

## 2022-10-02 MED ORDER — OXYBUTYNIN CHLORIDE ER 5 MG PO TB24
5.0000 mg | ORAL_TABLET | Freq: Every day | ORAL | Status: DC
  Administered 2022-10-02 – 2022-10-06 (×5): 5 mg via ORAL
  Filled 2022-10-02 (×6): qty 1

## 2022-10-02 MED ORDER — LIDOCAINE 5 % EX PTCH
1.0000 | MEDICATED_PATCH | CUTANEOUS | Status: DC
  Administered 2022-10-02 – 2022-10-06 (×2): 1 via TRANSDERMAL
  Filled 2022-10-02 (×4): qty 1

## 2022-10-02 NOTE — Care Management Important Message (Signed)
Important Message  Patient Details  Name: Dawn Henderson MRN: [id number] Date of Birth: 03/28/1934   Medicare Important Message Given:  Yes     Sherilyn Banker 10/02/2022, 2:31 PM

## 2022-10-02 NOTE — Progress Notes (Signed)
[facility] Progress Note   88 y.o. female  tachybrady syndrome with St. Jude pacemaker (07/2014), OA, PAD, HTN, urinary incontinence initially presented to [facility] for renal failure. She had presented prior to that to [facility] with abdominal pain diagnosed with sigmoid diverticulitis and treated with antibiotics (cipro + flagyl). IV contrast ( of Iohexol) on 8/2 and next labs were on 8/6 when Cr. was 3.5.   Urinalysis showed ketones with no proteinuria and no Hb, LE or nitrites.   Assessment/ Plan:   Renal failure: may be from contrast induced nephropathy; she was possibly prerenal at that time with abdominal pain and poor oral input. No obstruction on CT on 8/7 with only a small nephrolith noted on the lower side. She's had dysgeusia since COVID a few years ago and doesn't feel there's anything different.  - For now supportive care and hopefully we don't need to make the decision about dialysis in the future. Renal ultrasound -> no hydronephrosis and c/w neg CT at admission when she had renal failure. - Continue holding the IVF for now; encouraged her to increased PO intake; daughter not bedside this am. UOP 2.5L over 24hrs. She uses a Depends/ diaper which has been very heavy.   Hopefully she's in the polyuric phase of ATN and will start recovery phase soon. Renal function slightly improved and would like to see a downward trend. Encouraged pt to at least eat 1/2 her meal.  - Will continue to follow closely with you.   -Monitor Daily I/Os, Need daily weights  -Maintain MAP>65 for optimal renal perfusion.  - Avoid nephrotoxic agents such as IV contrast, NSAIDs, and phosphate containing bowel preps (FLEETS)   Hyponatremia: likely secondary to renal failure and poor solute intake + free water intake primarily +  component of SIADH from nausea and pain. - Fluid restrict to 1L per day -> improving. - TSH slightly elevated; cortisol no result. - Improving likely bec  of fluid restriction and stopping the IVF (SIADH). Tachybrady syndrome with PM. Diverticultities: on Rocephin and Flagyl.  Subjective:   Feeling a little worse today with back pain, upset stomach but no dyspnea.  Denies f/c/v.  Able to ambulate to the restroom; actually other niece bedside. I updated the patient.    Objective:   BP 139/67 (BP Location: Right Arm)   Pulse 64   Temp (!) 97.5 F (36.4 C) (Oral)   Resp 18   Ht 5\' 8"  (1.727 m)   Wt 94.1 kg   SpO2 97%   BMI 31.54 kg/m   Intake/Output Summary (Last 24 hours) at 10/02/2022 1340 Last data filed at 10/02/2022 0347 Gross per 24 hour  Intake 120 ml  Output 550 ml  Net -430 ml   Weight change:   Physical Exam: General appearance: alert, cooperative Head: NCAT Neck: no JVD Back: No CVA tenderness. Resp: CTA b/l Cardio: regular rate and rhythm GI: SNDNT+BS Extremities: edema 1+ Pulses: 2+ and symmetric GU: no foley  Imaging: No results found.  Labs: BMET Recent Labs  Lab 09/26/22 0926 09/27/22 1109 09/28/22 0616 09/29/22 0220 09/30/22 0634 10/01/22 0253 10/02/22 0652  NA 129* 127* 127* 129* 131* 133* 136  K 4.0 4.5 4.6 4.6 4.9 4.8 4.9  CL 99 96* 100 98 100 104 104  CO2 22 19* 17* 19* 19* 21* 23  GLUCOSE 145* 105* 89 94 92 95 96  BUN 24* 23 23 25* 28* 28* 29*  CREATININE 5.44* 5.41* 5.45* 5.55* 5.68* 5.59* 5.13*  CALCIUM 8.5* 8.3* 8.2* 8.2* 8.1* 8.3* 8.4*   CBC Recent Labs  Lab 09/26/22 0926 09/28/22 0616 09/30/22 0634 10/02/22 0652  WBC 5.7 4.8 4.2 4.8  HGB 12.1 10.7* 9.5* 10.0*  HCT 37.2 33.0* 30.0* 31.6*  MCV 89.9 89.9 88.8 90.3  PLT 129* 115* 130* 141*    Medications:     heparin  5,000 Units Subcutaneous Q8H   lidocaine  1 patch Transdermal Q24H   metoprolol tartrate  25 mg Oral TID   pantoprazole  40 mg Oral Daily      Paulene Floor, MD 10/02/2022, 1:40 PM

## 2022-10-02 NOTE — Plan of Care (Signed)

## 2022-10-02 NOTE — Progress Notes (Signed)
Progress Note   Patient: Dawn Henderson ZOX:[id number] DOB: [birth year] DOA: 09/23/2022     9 DOS: the patient was seen and examined on 10/02/2022   Brief hospital course:  Dawn Henderson is an 88 y.o. female past medical history significant for tachybradycardia syndrome status post pacemaker, osteoarthritis, essential hypertension transferred from [facility] for acute kidney injury and ongoing diverticulitis question failure antibiotic treatment Cipro Flagyl at the outpatient setting.  She was started on ceftriaxone and metronidazole completing antibiotic course today.  Assessment and Plan:   AKI (acute kidney injury) (HCC) With a baseline creatinine around 1.0, now has stabilized around 5.5 Urine output has remained appropriate, was actually improving. Nephrology was consulted renal ultrasound showed bilateral renal cyst. Renal recommended to continue to hold IV fluids, continue oral intake, as urine output has started to pick up she may have renal recovery. Hopefully she starting plateau. Nephrology input appreciated.   Acute sigmoid diverticulitis uncomplicated: Which failed outpatient Cipro and Flagyl. Just finished IV Rocephin and Flagyl,  CT scan of the abdomen pelvis showed no abscess. Pain has improved.     Hypertension  Continue metoprolol tartrate 25 mg p.o. 3 times daily. Metoprolol 5 mg IVP every 4 hours as needed for heart rate greater than 110 bpm.   History of tachybradycardia syndrome: follow-up with cardiology as an outpatient. Continue metoprolol tartrate as above.   Dependent BLE edema -Chronic - improved with elevated legs and compression stockings -Likely peripheral venous insufficiency given no central findings of volume overload   Hiatal hernia:  continue PPI, asymptomatic     Normocytic anemia Monitor hematocrit and hemoglobin. Transfuse as needed.    Thrombocytopenia Monitor platelet count.   Subjective: No acute distress.  Continues to urinate,  but stated she has been having a lot of incontinence at night which disturbs her sleep.  She would like to try something for this.  She also stated that she feels ill when she takes a tablet of tramadol.  She has sat experienced in the past with oxycodone, but would like to take on the half a tablet at a time.  Physical Exam: Vitals:   10/01/22 0714 10/01/22 1355 10/01/22 1936 10/02/22 0500  BP: (!) 148/65 135/62 139/67   Pulse: 71 65 64   Resp: 18 18    Temp: 97.9 F (36.6 C) 97.7 F (36.5 C) (!) 97.5 F (36.4 C)   TempSrc: Oral Oral Oral   SpO2: 95% 99% 97%   Weight:    94.1 kg  Height:       Physical Exam Vitals and nursing note reviewed.  Constitutional:      General: She is awake. She is not in acute distress.    Appearance: Normal appearance.  HENT:     Head: Normocephalic.     Nose: No rhinorrhea.     Mouth/Throat:     Mouth: Mucous membranes are moist.  Eyes:     General: No scleral icterus.    Pupils: Pupils are equal, round, and reactive to light.  Neck:     Vascular: No JVD.  Cardiovascular:     Rate and Rhythm: Normal rate and regular rhythm.  Pulmonary:     Effort: Pulmonary effort is normal.     Breath sounds: Normal breath sounds.  Abdominal:     General: Bowel sounds are normal. There is no distension.     Palpations: Abdomen is soft.     Tenderness: There is no abdominal tenderness.  Musculoskeletal:  Cervical back: Neck supple.     Right lower leg: Edema present.     Left lower leg: Edema present.     Comments: Frail.  Drip generalized weakness.  Compressions/dressings in place.  Skin:    General: Skin is warm and dry.  Neurological:     General: No focal deficit present.     Mental Status: She is alert and oriented to person, place, and time.  Psychiatric:        Behavior: Behavior is cooperative.    Data Reviewed:  There are no new results to review at this time.  Family Communication:   Disposition: Status is: Inpatient Remains  inpatient appropriate because:   Planned Discharge Destination: Home  Time spent: 50 minutes  This document was prepared using Dragon voice recognition software and may contain some unintended transcription errors.   Author: Bobette Mo, MD 10/02/2022 8:30 AM  For on call review [email or web address].

## 2022-10-03 DIAGNOSIS — N179 Acute kidney failure, unspecified: Secondary | ICD-10-CM | POA: Diagnosis not present

## 2022-10-03 LAB — COMPREHENSIVE METABOLIC PANEL
ALT: 6 U/L (ref 0–44)
AST: 12 U/L — ABNORMAL LOW (ref 15–41)
Albumin: 2.4 g/dL — ABNORMAL LOW (ref 3.5–5.0)
Alkaline Phosphatase: 29 U/L — ABNORMAL LOW (ref 38–126)
Anion gap: 9 (ref 5–15)
BUN: 28 mg/dL — ABNORMAL HIGH (ref 8–23)
CO2: 24 mmol/L (ref 22–32)
Calcium: 8.2 mg/dL — ABNORMAL LOW (ref 8.9–10.3)
Chloride: 105 mmol/L (ref 98–111)
Creatinine, Ser: 4.61 mg/dL — ABNORMAL HIGH (ref 0.44–1.00)
GFR, Estimated: 9 mL/min — ABNORMAL LOW (ref >60)
Glucose, Bld: 108 mg/dL — ABNORMAL HIGH (ref 70–99)
Potassium: 5.2 mmol/L — ABNORMAL HIGH (ref 3.5–5.1)
Sodium: 138 mmol/L (ref 135–145)
Total Bilirubin: 0.4 mg/dL (ref 0.3–1.2)
Total Protein: 5 g/dL — ABNORMAL LOW (ref 6.5–8.1)

## 2022-10-03 LAB — PHOSPHORUS: Phosphorus: 5.1 mg/dL — ABNORMAL HIGH (ref 2.5–4.6)

## 2022-10-03 MED ORDER — ALUM & MAG HYDROXIDE-SIMETH 200-200-20 MG/5ML PO SUSP
30.0000 mL | ORAL | Status: DC | PRN
  Administered 2022-10-03 – 2022-10-05 (×5): 30 mL via ORAL
  Filled 2022-10-03 (×6): qty 30

## 2022-10-03 NOTE — Progress Notes (Signed)
[facility] Progress Note   88 y.o. female  tachybrady syndrome with St. Jude pacemaker (07/2014), OA, PAD, HTN, urinary incontinence initially presented to [facility] for renal failure. She had presented prior to that to [facility] with abdominal pain diagnosed with sigmoid diverticulitis and treated with antibiotics (cipro + flagyl). IV contrast ( of Iohexol) on 8/2 and next labs were on 8/6 when Cr. was 3.5.   Urinalysis showed ketones with no proteinuria and no Hb, LE or nitrites.   Assessment/ Plan:   Renal failure: may be from contrast induced nephropathy; she was possibly prerenal at that time with abdominal pain and poor oral input. No obstruction on CT on 8/7 with only a small nephrolith noted on the lower side. She's had dysgeusia since COVID a few years ago and doesn't feel there's anything different.  - For now supportive care and hopefully we don't need to make the decision about dialysis in the future. Renal ultrasound -> no hydronephrosis and c/w neg CT at admission when she had renal failure. - Continue holding the IVF for now; encouraged her to increased PO intake; niece actually helping the pt walk the hallway this am.   She uses a Depends/ diaper and I doubt we're collecting all UOP.  Renal function slowly improving; signing off at this time; please reconsult as needed.  -Monitor Daily I/Os, Need daily weights  -Maintain MAP>65 for optimal renal perfusion.  - Avoid nephrotoxic agents such as IV contrast, NSAIDs, and phosphate containing bowel preps (FLEETS)   Hyponatremia: likely secondary to renal failure and poor solute intake + free water intake primarily +  component of SIADH from nausea and pain. - Fluid restrict to 1L per day -> improving. - TSH slightly elevated; cortisol no result. - Improving likely bec of fluid restriction and stopping the IVF (SIADH). Tachybrady syndrome with PM. Diverticultities: on Rocephin and Flagyl.  Subjective:    Feeling a slightly better today; still has back pain and an upset stomach but no dyspnea.  Denies f/c/v.  Able to ambulate to the restroom; also ambulating with walker and assistance in the hallway.  I updated the patient and niece.    Objective:   BP (!) 140/74 (BP Location: Right Arm)   Pulse 69   Temp 97.6 F (36.4 C)   Resp 17   Ht 5\' 8"  (1.727 m)   Wt 92.4 kg   SpO2 97%   BMI 30.97 kg/m   Intake/Output Summary (Last 24 hours) at 10/03/2022 2440 Last data filed at 10/02/2022 2029 Gross per 24 hour  Intake --  Output 951 ml  Net -951 ml   Weight change: -1.7 kg  Physical Exam: General appearance: alert, cooperative Head: NCAT Neck: no JVD Back: No CVA tenderness. Resp: CTA b/l Cardio: regular rate and rhythm GI: SNDNT+BS Extremities: edema 1+ Pulses: 2+ and symmetric GU: no foley  Imaging: No results found.  Labs: BMET Recent Labs  Lab 09/27/22 1109 09/28/22 0616 09/29/22 0220 09/30/22 0634 10/01/22 0253 10/02/22 0652 10/03/22 0115  NA 127* 127* 129* 131* 133* 136 138  K 4.5 4.6 4.6 4.9 4.8 4.9 5.2*  CL 96* 100 98 100 104 104 105  CO2 19* 17* 19* 19* 21* 23 24  GLUCOSE 105* 89 94 92 95 96 108*  BUN 23 23 25* 28* 28* 29* 28*  CREATININE 5.41* 5.45* 5.55* 5.68* 5.59* 5.13* 4.61*  CALCIUM 8.3* 8.2* 8.2* 8.1* 8.3* 8.4* 8.2*  PHOS  --   --   --   --   --   --  5.1*   CBC Recent Labs  Lab 09/28/22 0616 09/30/22 0634 10/02/22 0652  WBC 4.8 4.2 4.8  HGB 10.7* 9.5* 10.0*  HCT 33.0* 30.0* 31.6*  MCV 89.9 88.8 90.3  PLT 115* 130* 141*    Medications:     heparin  5,000 Units Subcutaneous Q8H   lidocaine  1 patch Transdermal Q24H   metoprolol tartrate  25 mg Oral TID   oxybutynin  5 mg Oral QHS   pantoprazole  40 mg Oral Daily      Paulene Floor, MD 10/03/2022, 9:52 AM

## 2022-10-03 NOTE — Plan of Care (Signed)
  Problem: Education: Goal: Knowledge of General Education information will improve Description: Including pain rating scale, medication(s)/side effects and non-pharmacologic comfort measures Outcome: Progressing   Problem: Health Behavior/Discharge Planning: Goal: Ability to manage health-related needs will improve Outcome: Progressing   Problem: Clinical Measurements: Goal: Ability to maintain clinical measurements within normal limits will improve Outcome: Progressing   Problem: Activity: Goal: Risk for activity intolerance will decrease Outcome: Progressing   Problem: Coping: Goal: Level of anxiety will decrease Outcome: Progressing   Problem: Elimination: Goal: Will not experience complications related to bowel motility Outcome: Progressing   Problem: Pain Managment: Goal: General experience of comfort will improve Outcome: Progressing   Problem: Safety: Goal: Ability to remain free from injury will improve Outcome: Progressing   

## 2022-10-03 NOTE — Progress Notes (Signed)
  Progress Note   Patient: Dawn Henderson ZOX:[id number] DOB: Jun 15, 1934 DOA: 09/23/2022     10 DOS: the patient was seen and examined on 10/03/2022   Brief hospital course:  Dawn Henderson is an 88 y.o. female past medical history significant for tachybradycardia syndrome status post pacemaker, osteoarthritis, essential hypertension transferred from [facility] for acute kidney injury and ongoing diverticulitis question failure antibiotic treatment Cipro Flagyl at the outpatient setting.  She was started on ceftriaxone and metronidazole and completed course.  Stay complicated by AKI.  Assessment and Plan:   AKI (acute kidney injury) (HCC) With a baseline creatinine around 1.0 Nephrology was consulted Hopefully she starting plateau.   Acute sigmoid diverticulitis uncomplicated: Which failed outpatient Cipro and Flagyl. Just finished IV Rocephin and Flagyl,  CT scan of the abdomen pelvis showed no abscess. Pain has improved.     Hypertension  Continue metoprolol tartrate 25 mg p.o. 3 times daily.   History of tachybradycardia syndrome: follow-up with cardiology as an outpatient. Continue metoprolol tartrate as above.   Dependent BLE edema -Chronic - improved with elevated legs and compression stockings -Likely peripheral venous insufficiency given no central findings of volume overload   Hiatal hernia:  continue PPI, asymptomatic     Normocytic anemia Monitor hematocrit and hemoglobin. Transfuse as needed.    Thrombocytopenia Monitor platelet count.   Subjective: nausea improved  Physical Exam: Vitals:   10/02/22 1921 10/03/22 0355 10/03/22 0500 10/03/22 0737  BP: (!) 129/95 (!) 141/75  (!) 140/74  Pulse:  66  69  Resp: 18 18  17   Temp: 98.5 F (36.9 C) (!) 97.4 F (36.3 C)  97.6 F (36.4 C)  TempSrc: Oral Oral    SpO2: 97% 93%  97%  Weight:   92.4 kg   Height:        General: Appearance:    Obese female in no acute distress     Lungs:     Clear to  auscultation bilaterally, respirations unlabored  Heart:    Normal heart rate. Normal rhythm. No murmurs, rubs, or gallops.   MS:   All extremities are intact.   Neurologic:   Awake, alert, oriented x 3    Data Reviewed: Cr trending down  Family Communication: niece at bedside   Time spent: 50 minutes    Author: Joseph Art, DO 10/03/2022 11:50 AM  For on call review [email or web address].

## 2022-10-04 DIAGNOSIS — N179 Acute kidney failure, unspecified: Secondary | ICD-10-CM | POA: Diagnosis not present

## 2022-10-04 LAB — CBC
HCT: 30.4 % — ABNORMAL LOW (ref 36.0–46.0)
Hemoglobin: 9.4 g/dL — ABNORMAL LOW (ref 12.0–15.0)
MCH: 28.4 pg (ref 26.0–34.0)
MCHC: 30.9 g/dL (ref 30.0–36.0)
MCV: 91.8 fL (ref 80.0–100.0)
Platelets: 152 10*3/uL (ref 150–400)
RBC: 3.31 MIL/uL — ABNORMAL LOW (ref 3.87–5.11)
RDW: 16.5 % — ABNORMAL HIGH (ref 11.5–15.5)
WBC: 4.7 10*3/uL (ref 4.0–10.5)
nRBC: 0 % (ref 0.0–0.2)

## 2022-10-04 LAB — BASIC METABOLIC PANEL
Anion gap: 8 (ref 5–15)
BUN: 30 mg/dL — ABNORMAL HIGH (ref 8–23)
CO2: 25 mmol/L (ref 22–32)
Calcium: 8.2 mg/dL — ABNORMAL LOW (ref 8.9–10.3)
Chloride: 105 mmol/L (ref 98–111)
Creatinine, Ser: 4.46 mg/dL — ABNORMAL HIGH (ref 0.44–1.00)
GFR, Estimated: 9 mL/min — ABNORMAL LOW (ref >60)
Glucose, Bld: 103 mg/dL — ABNORMAL HIGH (ref 70–99)
Potassium: 5.2 mmol/L — ABNORMAL HIGH (ref 3.5–5.1)
Sodium: 138 mmol/L (ref 135–145)

## 2022-10-04 MED ORDER — DOCUSATE SODIUM 100 MG PO CAPS
100.0000 mg | ORAL_CAPSULE | Freq: Two times a day (BID) | ORAL | Status: DC
  Administered 2022-10-04 – 2022-10-07 (×7): 100 mg via ORAL
  Filled 2022-10-04 (×7): qty 1

## 2022-10-04 MED ORDER — SODIUM ZIRCONIUM CYCLOSILICATE 10 G PO PACK
10.0000 g | PACK | Freq: Once | ORAL | Status: AC
  Administered 2022-10-04: 10 g via ORAL
  Filled 2022-10-04: qty 1

## 2022-10-04 MED ORDER — PANTOPRAZOLE SODIUM 40 MG PO TBEC
40.0000 mg | DELAYED_RELEASE_TABLET | Freq: Two times a day (BID) | ORAL | Status: DC
  Administered 2022-10-04 – 2022-10-07 (×6): 40 mg via ORAL
  Filled 2022-10-04 (×6): qty 1

## 2022-10-04 MED ORDER — FLUTICASONE PROPIONATE 50 MCG/ACT NA SUSP
2.0000 | Freq: Every day | NASAL | Status: DC
  Administered 2022-10-04 – 2022-10-05 (×2): 2 via NASAL
  Filled 2022-10-04: qty 16

## 2022-10-04 NOTE — Progress Notes (Signed)
Patient with complaints of pressure in chest under breast on right side. Stated it began 8/17 around 1830 after she ate dinner. Maaxlox given. Patients HR also sustaining in 130s. Provider notified & PRN lopressor given with no change. Provider aware.

## 2022-10-04 NOTE — Progress Notes (Signed)
  Progress Note   Patient: Dawn Henderson [id number]:[id number] DOB: 07-22-1934 DOA: 09/23/2022     11 DOS: the patient was seen and examined on 10/04/2022   Brief hospital course:  Dawn Henderson is an 88 y.o. female past medical history significant for tachybradycardia syndrome status post pacemaker, osteoarthritis, essential hypertension transferred from [facility] for acute kidney injury and ongoing diverticulitis question failure antibiotic treatment Cipro Flagyl at the outpatient setting.  She was started on ceftriaxone and metronidazole and completed course.  Stay complicated by AKI.  Assessment and Plan:   AKI (acute kidney injury) (HCC) With a baseline creatinine around 1.0 Nephrology was consulted Hopefully she starting plateau.   Acute sigmoid diverticulitis uncomplicated: Which failed outpatient Cipro and Flagyl. Just finished IV Rocephin and Flagyl,  CT scan of the abdomen pelvis showed no abscess.      Hypertension  Continue metoprolol tartrate 25 mg p.o. 3 times daily.   History of tachybradycardia syndrome: follow-up with cardiology as an outpatient. Continue metoprolol tartrate as above.   Dependent BLE edema -Chronic - improved with elevated legs and compression stockings -Likely peripheral venous insufficiency given no central findings of volume overload   Hiatal hernia:  continue PPI- in crease dose, asymptomatic  -small frequent meals    Normocytic anemia Monitor hematocrit and hemoglobin. Transfuse as needed.    Thrombocytopenia Monitor platelet count.   Subjective: some stomach pain last PM after dinner  Physical Exam: Vitals:   10/03/22 2237 10/04/22 0118 10/04/22 0513 10/04/22 0801  BP: 108/84 118/76 117/72 (!) 141/78  Pulse: (!) 136 (!) 133 81 67  Resp: 16 17 16 17   Temp:  98.2 F (36.8 C) 98.5 F (36.9 C) 98.9 F (37.2 C)  TempSrc:  Oral Oral Oral  SpO2: 95%  94% 95%  Weight:   93 kg   Height:         General: Appearance:    Obese female  in no acute distress     Lungs:     respirations unlabored  Heart:    Normal heart rate. Normal rhythm. No murmurs, rubs, or gallops.   MS:   All extremities are intact.   Neurologic:   Awake, alert    Data Reviewed: Cr trending down  Family Communication: niece at bedside   Time spent: 50 minutes    Author: Joseph Art, DO 10/04/2022 10:44 AM  For on call review [email or web address].

## 2022-10-05 DIAGNOSIS — N179 Acute kidney failure, unspecified: Secondary | ICD-10-CM | POA: Diagnosis not present

## 2022-10-05 LAB — RENAL FUNCTION PANEL
Albumin: 2.5 g/dL — ABNORMAL LOW (ref 3.5–5.0)
Anion gap: 9 (ref 5–15)
BUN: 28 mg/dL — ABNORMAL HIGH (ref 8–23)
CO2: 23 mmol/L (ref 22–32)
Calcium: 8.2 mg/dL — ABNORMAL LOW (ref 8.9–10.3)
Chloride: 106 mmol/L (ref 98–111)
Creatinine, Ser: 4.07 mg/dL — ABNORMAL HIGH (ref 0.44–1.00)
GFR, Estimated: 10 mL/min — ABNORMAL LOW (ref >60)
Glucose, Bld: 84 mg/dL (ref 70–99)
Phosphorus: 4.3 mg/dL (ref 2.5–4.6)
Potassium: 4.7 mmol/L (ref 3.5–5.1)
Sodium: 138 mmol/L (ref 135–145)

## 2022-10-05 LAB — CBC
HCT: 30.6 % — ABNORMAL LOW (ref 36.0–46.0)
Hemoglobin: 9.5 g/dL — ABNORMAL LOW (ref 12.0–15.0)
MCH: 28.4 pg (ref 26.0–34.0)
MCHC: 31 g/dL (ref 30.0–36.0)
MCV: 91.6 fL (ref 80.0–100.0)
Platelets: 131 10*3/uL — ABNORMAL LOW (ref 150–400)
RBC: 3.34 MIL/uL — ABNORMAL LOW (ref 3.87–5.11)
RDW: 16.4 % — ABNORMAL HIGH (ref 11.5–15.5)
WBC: 3.9 10*3/uL — ABNORMAL LOW (ref 4.0–10.5)
nRBC: 0 % (ref 0.0–0.2)

## 2022-10-05 NOTE — Plan of Care (Signed)
  Problem: Activity: Goal: Risk for activity intolerance will decrease Outcome: Progressing   Problem: Nutrition: Goal: Adequate nutrition will be maintained Outcome: Progressing   Problem: Elimination: Goal: Will not experience complications related to bowel motility Outcome: Progressing   Problem: Pain Managment: Goal: General experience of comfort will improve Outcome: Progressing   

## 2022-10-05 NOTE — Progress Notes (Signed)
   10/05/22 1033  Mobility  Activity Ambulated with assistance in hallway  Level of Assistance Modified independent, requires aide device or extra time  Assistive Device Front wheel walker  Distance Ambulated (ft) 250 ft  Activity Response Tolerated well  Mobility Referral Yes  $Mobility charge 1 Mobility  Mobility Specialist Start Time (ACUTE ONLY) 1020  Mobility Specialist Stop Time (ACUTE ONLY) 1030  Mobility Specialist Time Calculation (min) (ACUTE ONLY) 10 min   Mobility Specialist: Progress Note  During Mobility: HR 96, SpO2 94%  Pt agreeable to mobility session - received in chair. Pt took two standing breaks asymptotic throughout, VSS. Pt stated she can ambulate much further after having her pain meds. Pt returned to room with all needs met - left with niece assisting pt to BR.   Barnie Mort, BS Mobility Specialist Please contact via SecureChat or Rehab office at [phone].

## 2022-10-05 NOTE — Plan of Care (Signed)

## 2022-10-05 NOTE — Progress Notes (Signed)
PROGRESS NOTE    Dawn Henderson  ZOX:[id number] DOB: 04/27/1934 DOA: 09/23/2022 PCP: Lorelei Pont, DO  Brief Narrative:  This 88 yrs old female with past medical history significant for tachybradycardia syndrome status post pacemaker, osteoarthritis, essential hypertension transferred from [facility] for acute kidney injury and ongoing diverticulitis,  failure of antibiotics treatment Cipro Flagyl at the outpatient setting. She was started on ceftriaxone and metronidazole and completed course. Hospital course complicated by AKI.   Assessment & Plan:   Principal Problem:   AKI (acute kidney injury) (HCC) Active Problems:   Sigmoid diverticulitis   Hypertension   Normocytic anemia   Thrombocytopenia (HCC)   AKI (acute kidney injury) (HCC) Baseline serum creatinine around 1.0, creatinine on admission 5.55 Nephrology was consulted, serum creatinine plateau. Continue gentle IV hydration.  Monitor serum creatinine.   Acute sigmoid diverticulitis uncomplicated: She has failed outpatient treatment  (Cipro and Flagyl) Just finished IV Rocephin and Flagyl,  CT scan of the abdomen pelvis showed no abscess.   Hypertension: Continue metoprolol tartrate 25 mg p.o. 3 times daily.   History of tachybradycardia syndrome: Follow-up with cardiology as an outpatient. Continue metoprolol tartrate as above.   Dependent BLE edema Chronic - improved with elevated legs and compression stockings Likely peripheral venous insufficiency given no central findings of volume overload   Hiatal hernia: continue PPI- increase dose, asymptomatic Advised small frequent meals   Normocytic anemia Monitor hematocrit and hemoglobin. Transfuse as needed.   Thrombocytopenia Monitor platelet count.    DVT prophylaxis: Heparin sq Code Status: DNR Family Communication: Niece at bed side. Disposition Plan:   Status is: Inpatient Remains inpatient appropriate because: Admitted for failed outpatient  treatment for acute diverticulitis Hospital course complicated by acute kidney injury now recovering.    Consultants:  Nephrology  Procedures: None  Antimicrobials: Anti-infectives (From admission, onward)    Start     Dose/Rate Route Frequency Ordered Stop   09/23/22 1245  cefTRIAXone (ROCEPHIN) 2 g in sodium chloride 0.9 % 100 mL IVPB        2 g 200 mL/hr over 30 Minutes Intravenous Every 24 hours 09/23/22 1145 10/02/22 1239   09/23/22 1200  cefTRIAXone (ROCEPHIN) 1 g in sodium chloride 0.9 % 100 mL IVPB  Status:  Discontinued        1 g 200 mL/hr over 30 Minutes Intravenous Every 24 hours 09/23/22 1102 09/23/22 1145   09/23/22 1200  metroNIDAZOLE (FLAGYL) IVPB 500 mg        500 mg 100 mL/hr over 60 Minutes Intravenous Every 12 hours 09/23/22 1102 10/03/22 0126      Subjective: Patient was seen and examined at bedside.  Overnight events noted.   Patient reports doing better.  Both legs are swollen and appears chronic.  Objective: Vitals:   10/04/22 2110 10/04/22 2259 10/05/22 0325 10/05/22 0814  BP:  138/63 135/72 127/73  Pulse: (!) 125 80 86 88  Resp:  17 18 16   Temp:   98.4 F (36.9 C) 98.5 F (36.9 C)  TempSrc:   Oral Oral  SpO2:  97% 94% 98%  Weight:      Height:        Intake/Output Summary (Last 24 hours) at 10/05/2022 1556 Last data filed at 10/04/2022 2109 Gross per 24 hour  Intake --  Output 500 ml  Net -500 ml   Filed Weights   10/02/22 0500 10/03/22 0500 10/04/22 0513  Weight: 94.1 kg 92.4 kg 93 kg    Examination:  General exam:  Appears calm and comfortable, deconditioned, not in any distress. Respiratory system: Clear to auscultation. Respiratory effort normal. RR 13 Cardiovascular system: S1 & S2 heard, regular rate and rhythm, no murmur. Gastrointestinal system: Abdomen is non distended, soft and non tender.  Normal bowel sounds heard. Central nervous system: Alert and oriented X 3. No focal neurological deficits. Extremities: Edema+, No  cyanosis, no clubbing Skin: No rashes, lesions or ulcers Psychiatry: Judgement and insight appear normal. Mood & affect appropriate.     Data Reviewed: I have personally reviewed following labs and imaging studies  CBC: Recent Labs  Lab 09/30/22 0634 10/02/22 0652 10/04/22 0552 10/05/22 0342  WBC 4.2 4.8 4.7 3.9*  HGB 9.5* 10.0* 9.4* 9.5*  HCT 30.0* 31.6* 30.4* 30.6*  MCV 88.8 90.3 91.8 91.6  PLT 130* 141* 152 131*   Basic Metabolic Panel: Recent Labs  Lab 10/01/22 0253 10/02/22 0652 10/03/22 0115 10/04/22 0552 10/05/22 0342  NA 133* 136 138 138 138  K 4.8 4.9 5.2* 5.2* 4.7  CL 104 104 105 105 106  CO2 21* 23 24 25 23   GLUCOSE 95 96 108* 103* 84  BUN 28* 29* 28* 30* 28*  CREATININE 5.59* 5.13* 4.61* 4.46* 4.07*  CALCIUM 8.3* 8.4* 8.2* 8.2* 8.2*  PHOS  --   --  5.1*  --  4.3   GFR: Estimated Creatinine Clearance: 11.8 mL/min (A) (by C-G formula based on SCr of 4.07 mg/dL (H)). Liver Function Tests: Recent Labs  Lab 10/03/22 0115 10/05/22 0342  AST 12*  --   ALT 6  --   ALKPHOS 29*  --   BILITOT 0.4  --   PROT 5.0*  --   ALBUMIN 2.4* 2.5*   No results for input(s): "LIPASE", "AMYLASE" in the last 168 hours. No results for input(s): "AMMONIA" in the last 168 hours. Coagulation Profile: No results for input(s): "INR", "PROTIME" in the last 168 hours. Cardiac Enzymes: No results for input(s): "CKTOTAL", "CKMB", "CKMBINDEX", "TROPONINI" in the last 168 hours. BNP (last 3 results) No results for input(s): "PROBNP" in the last 8760 hours. HbA1C: No results for input(s): "HGBA1C" in the last 72 hours. CBG: No results for input(s): "GLUCAP" in the last 168 hours. Lipid Profile: No results for input(s): "CHOL", "HDL", "LDLCALC", "TRIG", "CHOLHDL", "LDLDIRECT" in the last 72 hours. Thyroid Function Tests: No results for input(s): "TSH", "T4TOTAL", "FREET4", "T3FREE", "THYROIDAB" in the last 72 hours. Anemia Panel: No results for input(s): "VITAMINB12",  "FOLATE", "FERRITIN", "TIBC", "IRON", "RETICCTPCT" in the last 72 hours. Sepsis Labs: No results for input(s): "PROCALCITON", "LATICACIDVEN" in the last 168 hours.  No results found for this or any previous visit (from the past 240 hour(s)).   Radiology Studies: No results found.  Scheduled Meds:  docusate sodium  100 mg Oral BID   fluticasone  2 spray Each Nare Daily   heparin  5,000 Units Subcutaneous Q8H   lidocaine  1 patch Transdermal Q24H   metoprolol tartrate  25 mg Oral TID   oxybutynin  5 mg Oral QHS   pantoprazole  40 mg Oral BID   Continuous Infusions:   LOS: 12 days    Time spent: 50 mins    Willeen Niece, MD [facility]   If 7PM-7AM, please contact night-coverage

## 2022-10-06 DIAGNOSIS — N179 Acute kidney failure, unspecified: Secondary | ICD-10-CM | POA: Diagnosis not present

## 2022-10-06 LAB — CBC
HCT: 30.4 % — ABNORMAL LOW (ref 36.0–46.0)
Hemoglobin: 9.4 g/dL — ABNORMAL LOW (ref 12.0–15.0)
MCH: 28.8 pg (ref 26.0–34.0)
MCHC: 30.9 g/dL (ref 30.0–36.0)
MCV: 93.3 fL (ref 80.0–100.0)
Platelets: 137 10*3/uL — ABNORMAL LOW (ref 150–400)
RBC: 3.26 MIL/uL — ABNORMAL LOW (ref 3.87–5.11)
RDW: 16 % — ABNORMAL HIGH (ref 11.5–15.5)
WBC: 3.8 10*3/uL — ABNORMAL LOW (ref 4.0–10.5)
nRBC: 0 % (ref 0.0–0.2)

## 2022-10-06 LAB — BASIC METABOLIC PANEL
Anion gap: 9 (ref 5–15)
BUN: 25 mg/dL — ABNORMAL HIGH (ref 8–23)
CO2: 24 mmol/L (ref 22–32)
Calcium: 8.5 mg/dL — ABNORMAL LOW (ref 8.9–10.3)
Chloride: 105 mmol/L (ref 98–111)
Creatinine, Ser: 3.75 mg/dL — ABNORMAL HIGH (ref 0.44–1.00)
GFR, Estimated: 11 mL/min — ABNORMAL LOW (ref >60)
Glucose, Bld: 112 mg/dL — ABNORMAL HIGH (ref 70–99)
Potassium: 4.8 mmol/L (ref 3.5–5.1)
Sodium: 138 mmol/L (ref 135–145)

## 2022-10-06 LAB — PHOSPHORUS: Phosphorus: 3.5 mg/dL (ref 2.5–4.6)

## 2022-10-06 LAB — MAGNESIUM: Magnesium: 1.2 mg/dL — ABNORMAL LOW (ref 1.7–2.4)

## 2022-10-06 MED ORDER — MAGNESIUM SULFATE 2 GM/50ML IV SOLN
2.0000 g | Freq: Once | INTRAVENOUS | Status: AC
  Administered 2022-10-06: 2 g via INTRAVENOUS
  Filled 2022-10-06: qty 50

## 2022-10-06 NOTE — Plan of Care (Signed)

## 2022-10-06 NOTE — Progress Notes (Signed)
PROGRESS NOTE    Dawn Henderson  ZOX:[id number] DOB: [birth year] DOA: 09/23/2022 PCP: Lorelei Pont, DO  Brief Narrative:  This 88 yrs old female with past medical history significant for tachybradycardia syndrome status post pacemaker, osteoarthritis, essential hypertension transferred from [facility] for acute kidney injury and ongoing diverticulitis,  failure of antibiotics treatment Cipro Flagyl at the outpatient setting. She was started on ceftriaxone and metronidazole and completed course. Hospital course complicated by AKI.   Assessment & Plan:   Principal Problem:   AKI (acute kidney injury) (HCC) Active Problems:   Sigmoid diverticulitis   Hypertension   Normocytic anemia   Thrombocytopenia (HCC)  AKI (acute kidney injury) (HCC) > Improving Baseline serum creatinine around 1.0, creatinine on admission 5.55 Nephrology was consulted, serum creatinine plateau. Continue gentle IV hydration.  Monitor serum creatinine.   Acute sigmoid diverticulitis uncomplicated: She has failed outpatient treatment  (Cipro and Flagyl) Just finished IV Rocephin and Flagyl,  CT scan of the abdomen pelvis showed no abscess.   Hypertension: Continue metoprolol tartrate 25 mg p.o. 3 times daily.   History of tachybradycardia syndrome: Follow-up with cardiology as an outpatient. Continue metoprolol tartrate as above.   Dependent BLE edema Chronic - improved with elevated legs and compression stockings Likely peripheral venous insufficiency given no central findings of volume overload   Hiatal hernia: Continue pantoprazole. Advised small frequent meals   Normocytic anemia Monitor hematocrit and hemoglobin. Transfuse as needed.   Thrombocytopenia Monitor platelet count.    DVT prophylaxis: Heparin sq Code Status: DNR Family Communication: Niece at bed side. Disposition Plan:   Status is: Inpatient Remains inpatient appropriate because: Admitted for failed outpatient treatment for  acute diverticulitis,  Hospital course complicated by acute kidney injury now recovering.    Consultants:  Nephrology  Procedures: None  Antimicrobials: Anti-infectives (From admission, onward)    Start     Dose/Rate Route Frequency Ordered Stop   09/23/22 1245  cefTRIAXone (ROCEPHIN) 2 g in sodium chloride 0.9 % 100 mL IVPB        2 g 200 mL/hr over 30 Minutes Intravenous Every 24 hours 09/23/22 1145 10/02/22 1239   09/23/22 1200  cefTRIAXone (ROCEPHIN) 1 g in sodium chloride 0.9 % 100 mL IVPB  Status:  Discontinued        1 g 200 mL/hr over 30 Minutes Intravenous Every 24 hours 09/23/22 1102 09/23/22 1145   09/23/22 1200  metroNIDAZOLE (FLAGYL) IVPB 500 mg        500 mg 100 mL/hr over 60 Minutes Intravenous Every 12 hours 09/23/22 1102 10/03/22 0126      Subjective: Patient was seen and examined at bedside.  Overnight events noted.   Patient reports doing much better.  Both legs are swollen and appears chronic.  Objective: Vitals:   10/05/22 2018 10/06/22 0334 10/06/22 0500 10/06/22 0808  BP: 137/68 (!) 145/65  (!) 141/70  Pulse: 83 60  68  Resp: 17 17  17   Temp: 98.3 F (36.8 C) 98.2 F (36.8 C)  97.7 F (36.5 C)  TempSrc: Oral   Oral  SpO2: 98% 94%  95%  Weight:   90.5 kg   Height:        Intake/Output Summary (Last 24 hours) at 10/06/2022 1433 Last data filed at 10/06/2022 1008 Gross per 24 hour  Intake 290 ml  Output 1450 ml  Net -1160 ml   Filed Weights   10/03/22 0500 10/04/22 0513 10/06/22 0500  Weight: 92.4 kg 93 kg 90.5 kg  Examination:  General exam: Appears calm and comfortable, deconditioned, not in any acute distress. Respiratory system: Clear to auscultation. Respiratory effort normal. RR 15 Cardiovascular system: S1 & S2 heard, regular rate and rhythm, no murmur. Gastrointestinal system: Abdomen is non distended, soft and non tender.  Normal bowel sounds heard. Central nervous system: Alert and oriented X 3. No focal neurological  deficits. Extremities: Edema+, chronic venous stasis, no cyanosis, no clubbing Skin: No rashes, lesions or ulcers Psychiatry: Judgement and insight appear normal. Mood & affect appropriate.     Data Reviewed: I have personally reviewed following labs and imaging studies  CBC: Recent Labs  Lab 09/30/22 0634 10/02/22 0652 10/04/22 0552 10/05/22 0342 10/06/22 0127  WBC 4.2 4.8 4.7 3.9* 3.8*  HGB 9.5* 10.0* 9.4* 9.5* 9.4*  HCT 30.0* 31.6* 30.4* 30.6* 30.4*  MCV 88.8 90.3 91.8 91.6 93.3  PLT 130* 141* 152 131* 137*   Basic Metabolic Panel: Recent Labs  Lab 10/02/22 0652 10/03/22 0115 10/04/22 0552 10/05/22 0342 10/06/22 0127  NA 136 138 138 138 138  K 4.9 5.2* 5.2* 4.7 4.8  CL 104 105 105 106 105  CO2 23 24 25 23 24   GLUCOSE 96 108* 103* 84 112*  BUN 29* 28* 30* 28* 25*  CREATININE 5.13* 4.61* 4.46* 4.07* 3.75*  CALCIUM 8.4* 8.2* 8.2* 8.2* 8.5*  MG  --   --   --   --  1.2*  PHOS  --  5.1*  --  4.3 3.5   GFR: Estimated Creatinine Clearance: 12.7 mL/min (A) (by C-G formula based on SCr of 3.75 mg/dL (H)). Liver Function Tests: Recent Labs  Lab 10/03/22 0115 10/05/22 0342  AST 12*  --   ALT 6  --   ALKPHOS 29*  --   BILITOT 0.4  --   PROT 5.0*  --   ALBUMIN 2.4* 2.5*   No results for input(s): "LIPASE", "AMYLASE" in the last 168 hours. No results for input(s): "AMMONIA" in the last 168 hours. Coagulation Profile: No results for input(s): "INR", "PROTIME" in the last 168 hours. Cardiac Enzymes: No results for input(s): "CKTOTAL", "CKMB", "CKMBINDEX", "TROPONINI" in the last 168 hours. BNP (last 3 results) No results for input(s): "PROBNP" in the last 8760 hours. HbA1C: No results for input(s): "HGBA1C" in the last 72 hours. CBG: No results for input(s): "GLUCAP" in the last 168 hours. Lipid Profile: No results for input(s): "CHOL", "HDL", "LDLCALC", "TRIG", "CHOLHDL", "LDLDIRECT" in the last 72 hours. Thyroid Function Tests: No results for input(s):  "TSH", "T4TOTAL", "FREET4", "T3FREE", "THYROIDAB" in the last 72 hours. Anemia Panel: No results for input(s): "VITAMINB12", "FOLATE", "FERRITIN", "TIBC", "IRON", "RETICCTPCT" in the last 72 hours. Sepsis Labs: No results for input(s): "PROCALCITON", "LATICACIDVEN" in the last 168 hours.  No results found for this or any previous visit (from the past 240 hour(s)).   Radiology Studies: No results found.  Scheduled Meds:  docusate sodium  100 mg Oral BID   fluticasone  2 spray Each Nare Daily   heparin  5,000 Units Subcutaneous Q8H   lidocaine  1 patch Transdermal Q24H   metoprolol tartrate  25 mg Oral TID   oxybutynin  5 mg Oral QHS   pantoprazole  40 mg Oral BID   Continuous Infusions:   LOS: 13 days    Time spent: 35 mins    Willeen Niece, MD [facility]   If 7PM-7AM, please contact night-coverage

## 2022-10-06 NOTE — Progress Notes (Signed)
   10/06/22 1204  Mobility  Activity Ambulated with assistance in hallway  Level of Assistance Modified independent, requires aide device or extra time  Assistive Device Front wheel walker  Distance Ambulated (ft) 250 ft  Activity Response Tolerated well  Mobility Referral Yes  $Mobility charge 1 Mobility  Mobility Specialist Start Time (ACUTE ONLY) 1150  Mobility Specialist Stop Time (ACUTE ONLY) 1200  Mobility Specialist Time Calculation (min) (ACUTE ONLY) 10 min   Mobility Specialist: Progress Note  During Mobility: HR 87, SpO2 90%  Pt agreeable to mobility session - received in chair. No pt complaints.  Pt returned to chair with all needs met - call bell within reach. Niece present.  Barnie Mort, BS Mobility Specialist Please contact via SecureChat or Rehab office at [phone].

## 2022-10-07 DIAGNOSIS — N179 Acute kidney failure, unspecified: Secondary | ICD-10-CM | POA: Diagnosis not present

## 2022-10-07 LAB — CBC
HCT: 33.3 % — ABNORMAL LOW (ref 36.0–46.0)
Hemoglobin: 10.3 g/dL — ABNORMAL LOW (ref 12.0–15.0)
MCH: 29.2 pg (ref 26.0–34.0)
MCHC: 30.9 g/dL (ref 30.0–36.0)
MCV: 94.3 fL (ref 80.0–100.0)
Platelets: 130 10*3/uL — ABNORMAL LOW (ref 150–400)
RBC: 3.53 MIL/uL — ABNORMAL LOW (ref 3.87–5.11)
RDW: 16.2 % — ABNORMAL HIGH (ref 11.5–15.5)
WBC: 3.6 10*3/uL — ABNORMAL LOW (ref 4.0–10.5)
nRBC: 0 % (ref 0.0–0.2)

## 2022-10-07 LAB — BASIC METABOLIC PANEL
Anion gap: 8 (ref 5–15)
BUN: 21 mg/dL (ref 8–23)
CO2: 25 mmol/L (ref 22–32)
Calcium: 8.8 mg/dL — ABNORMAL LOW (ref 8.9–10.3)
Chloride: 106 mmol/L (ref 98–111)
Creatinine, Ser: 3.34 mg/dL — ABNORMAL HIGH (ref 0.44–1.00)
GFR, Estimated: 13 mL/min — ABNORMAL LOW (ref >60)
Glucose, Bld: 95 mg/dL (ref 70–99)
Potassium: 4.8 mmol/L (ref 3.5–5.1)
Sodium: 139 mmol/L (ref 135–145)

## 2022-10-07 LAB — MAGNESIUM: Magnesium: 1.6 mg/dL — ABNORMAL LOW (ref 1.7–2.4)

## 2022-10-07 MED ORDER — SENNOSIDES-DOCUSATE SODIUM 8.6-50 MG PO TABS: 1.0000 | ORAL_TABLET | Freq: Every evening | ORAL | 0 refills | Status: AC | PRN

## 2022-10-07 MED ORDER — OXYBUTYNIN CHLORIDE ER 5 MG PO TB24: 5.0000 mg | ORAL_TABLET | Freq: Every day | ORAL | 0 refills | Status: AC

## 2022-10-07 MED ORDER — METOPROLOL TARTRATE 25 MG PO TABS: 25.0000 mg | ORAL_TABLET | Freq: Three times a day (TID) | ORAL | 0 refills | Status: AC

## 2022-10-07 MED ORDER — LIDOCAINE 5 % EX PTCH: 1.0000 | MEDICATED_PATCH | CUTANEOUS | 0 refills | Status: DC

## 2022-10-07 MED ORDER — FLUTICASONE PROPIONATE 50 MCG/ACT NA SUSP: 2.0000 | Freq: Every day | NASAL | 0 refills | Status: AC

## 2022-10-07 NOTE — Discharge Summary (Signed)
Physician Discharge Summary  Dawn Henderson GNF:[id number] DOB: March 08, 1934 DOA: 09/23/2022  PCP: Lorelei Pont, DO  Admit date: 09/23/2022 Discharge date: 10/07/2022  Admitted From: Home Disposition:  Home  Recommendations for Outpatient Follow-up:  Follow up with PCP in 1-2 weeks  Home Health: None Equipment/Devices: None  Discharge Condition:Stable  CODE STATUS:DNR  Diet recommendation: As tolerated    Brief/Interim Summary: This 88 yrs old female with past medical history significant for tachybradycardia syndrome status post pacemaker, osteoarthritis, essential hypertension transferred from [facility] for acute kidney injury and ongoing diverticulitis,  failure of antibiotics treatment Cipro Flagyl at the outpatient setting. She was started on ceftriaxone and metronidazole and completed course. Hospital course complicated by AKI.   Renal function now downtrending towards normal with PO intake only. No other indication for close monitoring or repeat labs at this time. Recommend outpatient follow up with PCP for repeat labs next week to ensure resolution of AKI - likely contrast induced nephropathy. Initial complain of abdominal discomfort/pain/diverticulitis has resolved. Antibiotics completed.  Discharge Diagnoses:  Principal Problem:   AKI (acute kidney injury) (HCC) Active Problems:   Sigmoid diverticulitis   Hypertension   Normocytic anemia   Thrombocytopenia (HCC)    Discharge Instructions   Allergies as of 10/07/2022       Reactions   Bl-contrast [iohexol & Lidocaine & Betameth] Other (See Comments)   Stop kidney function..... Please no dye of any type per pt.    Sulfa Antibiotics Other (See Comments)   Can not remember rxn.    Penicillins Rash   Small rash in her 20's        Medication List     TAKE these medications    cyanocobalamin 1000 MCG/ML injection Commonly known as: VITAMIN B12 Inject 1,000 mcg into the skin every 30 (thirty) days.    fluticasone 50 MCG/ACT nasal spray Commonly known as: FLONASE Place 2 sprays into both nostrils daily. Start taking on: October 08, 2022   lidocaine 5 % Commonly known as: LIDODERM Place 1 patch onto the skin daily. Remove & Discard patch within 12 hours or as directed by MD   metoprolol tartrate 25 MG tablet Commonly known as: LOPRESSOR Take 1 tablet (25 mg total) by mouth 3 (three) times daily. What changed:  medication strength how much to take   omeprazole 40 MG capsule Commonly known as: PRILOSEC Take 40 mg by mouth daily.   oxybutynin 5 MG 24 hr tablet Commonly known as: DITROPAN-XL Take 1 tablet (5 mg total) by mouth at bedtime.   senna-docusate 8.6-50 MG tablet Commonly known as: Senokot-S Take 1 tablet by mouth at bedtime as needed for moderate constipation.        Follow-up Information     Lorelei Pont, DO Follow up.   Specialty: Family Medicine Contact information: [address] [place] [address] [phone]                Allergies  Allergen Reactions   Bl-Contrast [Iohexol & Lidocaine & Betameth] Other (See Comments)    Stop kidney function..... Please no dye of any type per pt.    Sulfa Antibiotics Other (See Comments)    Can not remember rxn.    Penicillins Rash    Small rash in her 20's    Consultations: Nephrology   Procedures/Studies: US RENAL  Result Date: 09/28/2022 CLINICAL DATA:  Acute kidney failure EXAM: RENAL / URINARY TRACT ULTRASOUND COMPLETE COMPARISON:  CT 09/23/2022 and older FINDINGS: Right Kidney: Renal measurements: 10.1 x  5.1 x 4.3 cm = volume: 116 mL. Echogenicity within normal limits. No mass or hydronephrosis visualized. There is a parapelvic cyst identified on the right measuring 13 mm. Left Kidney: Renal measurements: 8.7 x 4.7 x 4.1 cm = volume: 88.1 mL. No collecting system dilatation. No perinephric fluid. Simple appearing cysts identified in the midportion measuring 2.4 cm. Bladder: Underdistended.   Preserved contour. Other: Limited by overlapping bowel gas and soft tissue IMPRESSION: No collecting system dilatation. Bilateral simple appearing renal cysts. No specific imaging follow-up. Please correlate with prior CT examinations. Electronically Signed   By: Karen Kays M.D.   On: 09/28/2022 15:19   CT ABDOMEN PELVIS WO CONTRAST  Result Date: 09/23/2022 CLINICAL DATA:  Abdominal pain, acute, nonlocalized EXAM: CT ABDOMEN AND PELVIS WITHOUT CONTRAST TECHNIQUE: Multidetector CT imaging of the abdomen and pelvis was performed following the standard protocol without IV contrast. RADIATION DOSE REDUCTION: This exam was performed according to the departmental dose-optimization program which includes automated exposure control, adjustment of the mA and/or kV according to patient size and/or use of iterative reconstruction technique. COMPARISON:  09/18/2022 FINDINGS: Lower chest: No acute abnormality. Hepatobiliary: No focal liver abnormality is seen. Status post cholecystectomy. No biliary dilatation. Pancreas: Unremarkable Spleen: Unremarkable Adrenals/Urinary Tract: The adrenal glands are unremarkable. The kidneys are normal in size and position. There is progressive, mild bilateral perinephric stranding which may be inflammatory in nature and reflective of an underlying nephritis. No hydronephrosis. 2 mm nonobstructing calculus noted within the lower pole of the left kidney. No ureteral calculi allowing for obscuration of the distal right ureter by streak artifact. The visualized bladder is unremarkable. Stomach/Bowel: Moderate sigmoid diverticulosis. There is very mild peri-diverticular inflammatory stranding involving the proximal sigmoid colon best seen on axial image # 70/3 and coronal image # 91/6 in keeping with changes of very mild uncomplicated sigmoid diverticulitis. This appears improved since prior examination. The stomach, small bowel, and large bowel are unremarkable and there is no evidence of  obstruction or perforation. No free intraperitoneal gas or fluid. No loculated intra-abdominal fluid collections. Appendix normal. Vascular/Lymphatic: Aortic atherosclerosis. No enlarged abdominal or pelvic lymph nodes. Reproductive: Status post hysterectomy. No adnexal masses. Other: No abdominal wall hernia Musculoskeletal: Right total hip arthroplasty has been performed. Severe left hip degenerative arthritis. Osseous structures are diffusely osteopenic. No acute bone abnormality. No suspicious lytic or blastic bone lesion identified. IMPRESSION: 1. Moderate sigmoid diverticulosis with very mild peri-diverticular inflammatory stranding involving the proximal sigmoid colon in keeping with changes of very mild uncomplicated sigmoid diverticulitis. This appears improved since prior examination. 2. Progressive, mild bilateral perinephric stranding which may be inflammatory in nature and reflective of an underlying nephritis. Correlation with renal function tests and urinalysis may be helpful. 3. 2 mm nonobstructing calculus within the lower pole of the left kidney. 4. Aortic atherosclerosis. Aortic Atherosclerosis (ICD10-I70.0). Electronically Signed   By: Helyn Numbers M.D.   On: 09/23/2022 19:52   CT ABDOMEN PELVIS W CONTRAST  Result Date: 09/18/2022 CLINICAL DATA:  Urinary frequency and burning. Lower abdominal pain. EXAM: CT ABDOMEN AND PELVIS WITH CONTRAST TECHNIQUE: Multidetector CT imaging of the abdomen and pelvis was performed using the standard protocol following bolus administration of intravenous contrast. RADIATION DOSE REDUCTION: This exam was performed according to the departmental dose-optimization program which includes automated exposure control, adjustment of the mA and/or kV according to patient size and/or use of iterative reconstruction technique. CONTRAST:  OMNIPAQUE IOHEXOL 300 MG/ML  SOLN COMPARISON:  None Available. FINDINGS: Lower chest:  Slight linear opacity seen along bases  likely scar or atelectasis. No pleural effusion. Pacemaker leads along the right side of the heart. Slight enlargement of the right atrium. No pericardial effusion. Hepatobiliary: No focal liver abnormality is seen. Status post cholecystectomy. No biliary dilatation. Patent portal vein. Pancreas: Mild atrophy of the pancreas.  No obvious pancreatic mass. Spleen: Normal in size without focal abnormality. Adrenals/Urinary Tract: Adrenal glands are preserved. There is some atrophy of the left kidney. Several scattered Bosniak 1 benign cysts are seen. There also some parapelvic cysts. No follow-up of these lesions. There is a hyperdense area along the lower pole calyx on the left which is ectatic best seen on series 2 image 32 anteriorly. An underlying mass lesion is possible. Recommend further workup when appropriate. The ureters have normal course and caliber down to the bladder. Bladder is nondilated. Stomach/Bowel: Scattered colonic stool. The large bowel is nondilated. However there is significant wall thickening and stranding along the proximal sigmoid colon. There are several diverticula in the near this location in the findings would be most consistent with a diverticulitis. No complex features of obstruction, free air or rim enhancing fluid collection. Scattered colonic stool elsewhere. Normal appendix in the right lower quadrant. The stomach is underdistended. Small bowel is nondilated. Vascular/Lymphatic: Aortic atherosclerosis. No enlarged abdominal or pelvic lymph nodes. Reproductive: Status post hysterectomy. No adnexal masses. Other: Small amount of simple free fluid in the pelvis. No free intra-air. Musculoskeletal: Scattered degenerative changes of the spine and pelvis. Multilevel disc bulging. Osteopenia. There is a significant joint effusion about the left hip with the advanced degenerative changes of the left hip. Right hip arthroplasty. IMPRESSION: Wall thickening with stranding along the sigmoid  colon with diverticula consistent with an area of diverticulitis. No complex features at this time of obstruction, free air or abscess formation. Recommend follow up after treatment to confirm resolution and exclude secondary pathology. There is a small amount of free fluid in the pelvis. Dilatation with high density along the lower pole calyx on the left kidney. An aggressive urothelial lesion is possible. Recommend dedicated workup when clinically appropriate. Electronically Signed   By: Karen Kays M.D.   On: 09/18/2022 15:09     Subjective: No acute issues/events overnight   Discharge Exam: Vitals:   10/07/22 0432 10/07/22 0828  BP: (!) 145/68 (!) 157/83  Pulse: (!) 59 79  Resp:  16  Temp:  97.6 F (36.4 C)  SpO2: 98% 96%   Vitals:   10/07/22 0400 10/07/22 0432 10/07/22 0500 10/07/22 0828  BP:  (!) 145/68  (!) 157/83  Pulse:  (!) 59  79  Resp:    16  Temp: 98.4 F (36.9 C)   97.6 F (36.4 C)  TempSrc: Oral   Oral  SpO2:  98%  96%  Weight:   89.8 kg   Height:        General: Pt is alert, awake, not in acute distress Cardiovascular: RRR, S1/S2 +, no rubs, no gallops Respiratory: CTA bilaterally, no wheezing, no rhonchi Abdominal: Soft, NT, ND, bowel sounds + Extremities: no edema, no cyanosis   The results of significant diagnostics from this hospitalization (including imaging, microbiology, ancillary and laboratory) are listed below for reference.     Microbiology: No results found for this or any previous visit (from the past 240 hour(s)).   Labs: BNP (last 3 results) No results for input(s): "BNP" in the last 8760 hours. Basic Metabolic Panel: Recent Labs  Lab 10/03/22 0115 10/04/22  2956 10/05/22 0342 10/06/22 0127 10/07/22 0423  NA 138 138 138 138 139  K 5.2* 5.2* 4.7 4.8 4.8  CL 105 105 106 105 106  CO2 24 25 23 24 25   GLUCOSE 108* 103* 84 112* 95  BUN 28* 30* 28* 25* 21  CREATININE 4.61* 4.46* 4.07* 3.75* 3.34*  CALCIUM 8.2* 8.2* 8.2* 8.5* 8.8*   MG  --   --   --  1.2* 1.6*  PHOS 5.1*  --  4.3 3.5  --    Liver Function Tests: Recent Labs  Lab 10/03/22 0115 10/05/22 0342  AST 12*  --   ALT 6  --   ALKPHOS 29*  --   BILITOT 0.4  --   PROT 5.0*  --   ALBUMIN 2.4* 2.5*   No results for input(s): "LIPASE", "AMYLASE" in the last 168 hours. No results for input(s): "AMMONIA" in the last 168 hours. CBC: Recent Labs  Lab 10/02/22 0652 10/04/22 0552 10/05/22 0342 10/06/22 0127 10/07/22 0423  WBC 4.8 4.7 3.9* 3.8* 3.6*  HGB 10.0* 9.4* 9.5* 9.4* 10.3*  HCT 31.6* 30.4* 30.6* 30.4* 33.3*  MCV 90.3 91.8 91.6 93.3 94.3  PLT 141* 152 131* 137* 130*   Cardiac Enzymes: No results for input(s): "CKTOTAL", "CKMB", "CKMBINDEX", "TROPONINI" in the last 168 hours. BNP: Invalid input(s): "POCBNP" CBG: No results for input(s): "GLUCAP" in the last 168 hours. D-Dimer No results for input(s): "DDIMER" in the last 72 hours. Hgb A1c No results for input(s): "HGBA1C" in the last 72 hours. Lipid Profile No results for input(s): "CHOL", "HDL", "LDLCALC", "TRIG", "CHOLHDL", "LDLDIRECT" in the last 72 hours. Thyroid function studies No results for input(s): "TSH", "T4TOTAL", "T3FREE", "THYROIDAB" in the last 72 hours.  Invalid input(s): "FREET3" Anemia work up No results for input(s): "VITAMINB12", "FOLATE", "FERRITIN", "TIBC", "IRON", "RETICCTPCT" in the last 72 hours. Urinalysis    Component Value Date/Time   COLORURINE YELLOW 09/18/2022 1254   APPEARANCEUR CLEAR 09/18/2022 1254   LABSPEC 1.016 09/18/2022 1254   PHURINE 6.0 09/18/2022 1254   GLUCOSEU NEGATIVE 09/18/2022 1254   HGBUR NEGATIVE 09/18/2022 1254   BILIRUBINUR NEGATIVE 09/18/2022 1254   KETONESUR 5 (A) 09/18/2022 1254   PROTEINUR NEGATIVE 09/18/2022 1254   UROBILINOGEN 0.2 08/10/2014 1425   NITRITE NEGATIVE 09/18/2022 1254   LEUKOCYTESUR NEGATIVE 09/18/2022 1254   Sepsis Labs Recent Labs  Lab 10/04/22 0552 10/05/22 0342 10/06/22 0127 10/07/22 0423  WBC 4.7  3.9* 3.8* 3.6*   Microbiology No results found for this or any previous visit (from the past 240 hour(s)).   Time coordinating discharge: Over 30 minutes  SIGNED:   Azucena Fallen, DO [facility] 10/07/2022, 3:10 PM Pager   If 7PM-7AM, please contact night-coverage [email or web address]

## 2022-10-07 NOTE — Progress Notes (Signed)
   10/07/22 1157  Mobility  Activity Ambulated independently in hallway;Ambulated independently to bathroom  Level of Assistance Modified independent, requires aide device or extra time  Assistive Device Front wheel walker  Distance Ambulated (ft) 250 ft  Activity Response Tolerated well  Mobility Referral Yes  $Mobility charge 1 Mobility  Mobility Specialist Start Time (ACUTE ONLY) 1137  Mobility Specialist Stop Time (ACUTE ONLY) 1154  Mobility Specialist Time Calculation (min) (ACUTE ONLY) 17 min   Mobility Specialist: Progress Note  During Mobility: HR 146 , SpO2 89% RA Post-Mobility: HR 90s, SpO2 100% RA  Pt agreeable to mobility session - received in chair. Pt c/o L. Hip pain during hallway ambulation  and shakiness after BR use. Pt returned to chair with all needs met - call bell within reach.  Barnie Mort, BS Mobility Specialist Please contact via SecureChat or Rehab office at [phone].

## 2022-10-08 ENCOUNTER — Other Ambulatory Visit: Payer: Self-pay | Admitting: Internal Medicine

## 2022-10-08 MED ORDER — LIDOCAINE-PRILOCAINE 2.5-2.5 % EX CREA: 1.0000 | TOPICAL_CREAM | CUTANEOUS | 0 refills | Status: AC | PRN
